# Patient Record
Sex: Male | Born: 1975 | Race: White | Hispanic: No | Marital: Married | Smoking: Never smoker
Health system: Southern US, Community
[De-identification: ages and names within clinical notes are randomized; demographics above are authoritative.]

## PROBLEM LIST (undated history)

## (undated) DIAGNOSIS — T7840XA Allergy, unspecified, initial encounter: Secondary | ICD-10-CM

## (undated) DIAGNOSIS — C9140 Hairy cell leukemia not having achieved remission: Secondary | ICD-10-CM

## (undated) HISTORY — DX: Allergy, unspecified, initial encounter: T78.40XA

## (undated) HISTORY — DX: Hairy cell leukemia not having achieved remission: C91.40

## (undated) HISTORY — PX: ROTATOR CUFF REPAIR: SHX139

## (undated) HISTORY — PX: INGUINAL HERNIA REPAIR: SUR1180

---

## 2006-11-05 ENCOUNTER — Ambulatory Visit: Payer: Self-pay | Admitting: Orthopaedic Surgery

## 2006-11-29 ENCOUNTER — Ambulatory Visit: Payer: Self-pay | Admitting: Orthopaedic Surgery

## 2008-03-04 ENCOUNTER — Ambulatory Visit: Payer: Self-pay | Admitting: Surgery

## 2008-03-05 ENCOUNTER — Ambulatory Visit: Payer: Self-pay | Admitting: Surgery

## 2009-09-25 HISTORY — PX: HERNIA REPAIR: SHX51

## 2009-10-18 ENCOUNTER — Encounter: Admission: RE | Admit: 2009-10-18 | Discharge: 2009-10-18 | Payer: Self-pay | Admitting: General Surgery

## 2009-11-18 ENCOUNTER — Ambulatory Visit (HOSPITAL_COMMUNITY): Admission: RE | Admit: 2009-11-18 | Discharge: 2009-11-19 | Payer: Self-pay | Admitting: General Surgery

## 2010-09-29 ENCOUNTER — Emergency Department: Payer: Self-pay | Admitting: Emergency Medicine

## 2010-12-15 LAB — DIFFERENTIAL
Basophils Relative: 0 % (ref 0–1)
Eosinophils Absolute: 0.1 10*3/uL (ref 0.0–0.7)
Eosinophils Relative: 1 % (ref 0–5)
Monocytes Relative: 5 % (ref 3–12)
Neutrophils Relative %: 74 % (ref 43–77)

## 2010-12-15 LAB — CBC
HCT: 46.5 % (ref 39.0–52.0)
Platelets: 155 10*3/uL (ref 150–400)
RDW: 13.7 % (ref 11.5–15.5)

## 2012-11-11 ENCOUNTER — Emergency Department: Payer: Self-pay | Admitting: Emergency Medicine

## 2014-03-30 ENCOUNTER — Ambulatory Visit: Payer: Self-pay | Admitting: Family Medicine

## 2014-04-02 ENCOUNTER — Encounter: Payer: Self-pay | Admitting: Podiatry

## 2014-04-02 ENCOUNTER — Ambulatory Visit (INDEPENDENT_AMBULATORY_CARE_PROVIDER_SITE_OTHER): Payer: 59 | Admitting: Podiatry

## 2014-04-02 ENCOUNTER — Ambulatory Visit (INDEPENDENT_AMBULATORY_CARE_PROVIDER_SITE_OTHER): Payer: 59

## 2014-04-02 VITALS — BP 130/78 | HR 84 | Resp 16 | Ht 73.0 in | Wt 235.0 lb

## 2014-04-02 DIAGNOSIS — M779 Enthesopathy, unspecified: Secondary | ICD-10-CM

## 2014-04-02 DIAGNOSIS — M7750 Other enthesopathy of unspecified foot: Secondary | ICD-10-CM

## 2014-04-02 DIAGNOSIS — M778 Other enthesopathies, not elsewhere classified: Secondary | ICD-10-CM

## 2014-04-02 MED ORDER — METHYLPREDNISOLONE (PAK) 4 MG PO TABS: ORAL_TABLET | ORAL | Status: DC

## 2014-04-02 NOTE — Progress Notes (Signed)
Kenneth Friedman presents today with a chief complaint of pain to his right anterior leg, his right lateral leg, his right knee his right ankle and the dorsal aspect of his right foot. He states this is been going on for about 2 months and he denies any trauma. He does go on to say that he has recently started exercising to lose weight and he plays ball with his children. His recently been seen by [facility] who confirmed an overuse injury. He was placed in a Cam Walker which the patient states that healing wore for short time.  Objective: Vital signs are stable he is alert and oriented x3. I have reviewed his past medical history medications allergies surgeries social history review of systems. Pulses are strongly palpable bilateral. Neurologic sensorium is intact per Semmes-Weinstein monofilament. Deep tendon reflexes are brisk and equal bilateral. Muscle strength is 5 over 5 dorsiflexors plantar flexors inverters everters all intrinsic musculature is intact. Orthopedic evaluation demonstrates all joints distal to the ankle a full range of motion without crepitus. He has no reproducible pain in the leg laterally or anteriorly. He has no reproducible pain in the ankle or the dorsum of the foot. He does have pain on sharp inversion and eversion of the subtalar joint with pain on palpation of the sinus tarsi of the right foot. Radiographic evaluation does not demonstrate any type of osseous abnormalities to either the ankle or the foot. There does appear to be some soft tissue increase in density at the tendo Achilles and some fluid collection in the anterior ankle.  Assessment: Knee pain associated with and over use injury right. Right leg pain associated with overuse injury. Subtalar joint capsulitis right foot.  Plan: Discussed etiology pathology conservative versus surgical therapies. I suggested an injection to the subtalar joint of the right foot. Which she declined. So I encouraged him to wear his Cam Walker. I wrote a  prescription which was electronically prescribed to his pharmacy for Medrol Dosepak to be followed by meloxicam. I will followup with him in one month and he will continue to ice the leg and foot. He will discontinue all extracurricular activities.

## 2014-04-30 ENCOUNTER — Encounter: Payer: Self-pay | Admitting: Podiatry

## 2014-04-30 ENCOUNTER — Ambulatory Visit (INDEPENDENT_AMBULATORY_CARE_PROVIDER_SITE_OTHER): Payer: 59 | Admitting: Podiatry

## 2014-04-30 VITALS — BP 116/67 | HR 81 | Resp 16

## 2014-04-30 DIAGNOSIS — M778 Other enthesopathies, not elsewhere classified: Secondary | ICD-10-CM

## 2014-04-30 DIAGNOSIS — M779 Enthesopathy, unspecified: Secondary | ICD-10-CM

## 2014-04-30 DIAGNOSIS — M7750 Other enthesopathy of unspecified foot: Secondary | ICD-10-CM

## 2014-04-30 NOTE — Progress Notes (Signed)
He presents today for followup of his capsulitis subtalar joint right his Achilles tendinitis. He states it is doing much better. He may longer has burning in his leg. He failed to take his Medrol Dosepak and his meloxicam.  Objective: Vital signs are stable he is alert and oriented x3. He has minimal tenderness on palpation of either the sinus tarsi or of the Achilles as it inserts on the right calcaneus.  Assessment: Well-healing tendinitis and capsulitis right foot.  Plan: Encouraged him to take his medication and we dispensed a night splint. I will followup with him in one month if necessary.

## 2014-06-04 ENCOUNTER — Ambulatory Visit (INDEPENDENT_AMBULATORY_CARE_PROVIDER_SITE_OTHER): Payer: 59 | Admitting: Podiatry

## 2014-06-04 ENCOUNTER — Encounter: Payer: Self-pay | Admitting: Podiatry

## 2014-06-04 DIAGNOSIS — M779 Enthesopathy, unspecified: Secondary | ICD-10-CM

## 2014-06-04 DIAGNOSIS — M7750 Other enthesopathy of unspecified foot: Secondary | ICD-10-CM

## 2014-06-04 DIAGNOSIS — M778 Other enthesopathies, not elsewhere classified: Secondary | ICD-10-CM

## 2014-06-04 NOTE — Progress Notes (Signed)
He presents today for followup of his subtalar joint capsulitis right his tendo Achilles tendinitis right. He states it is doing a lot better.  Objective: Vital signs are stable he is alert and oriented x3. His no reproducible pain on palpation of the tendo Achilles oral subtalar joint range of motion.  Assessment: Well-healing insertional tendo Achilles tendinitis and capsulitis of the subtalar joint right.  Plan: Continue all conservative therapies x1 more month and then discontinue it is pain has not recurred.

## 2014-07-07 ENCOUNTER — Ambulatory Visit: Payer: Self-pay

## 2014-07-07 LAB — CBC CANCER CENTER
Bands: 1 %
Eosinophil: 1 %
HCT: 46.4 % (ref 40.0–52.0)
HGB: 15.8 g/dL (ref 13.0–18.0)
Lymphocytes: 36 %
MCH: 32 pg (ref 26.0–34.0)
MCHC: 33.9 g/dL (ref 32.0–36.0)
MCV: 94 fL (ref 80–100)
MONOS PCT: 3 %
PLATELETS: 95 x10 3/mm — AB (ref 150–440)
RBC: 4.93 10*6/uL (ref 4.40–5.90)
RDW: 14.3 % (ref 11.5–14.5)
Segmented Neutrophils: 53 %
Variant Lymphocyte: 6 %
WBC: 2.4 x10 3/mm — ABNORMAL LOW (ref 3.8–10.6)

## 2014-07-07 LAB — PROTIME-INR
INR: 1.1
Prothrombin Time: 13.9 secs (ref 11.5–14.7)

## 2014-07-07 LAB — FIBRIN DEGRADATION PROD.(ARMC ONLY)

## 2014-07-07 LAB — LACTATE DEHYDROGENASE: LDH: 135 U/L (ref 85–241)

## 2014-07-07 LAB — APTT: ACTIVATED PTT: 33.5 s (ref 23.6–35.9)

## 2014-07-21 LAB — CBC CANCER CENTER
BASOS ABS: 0 x10 3/mm (ref 0.0–0.1)
BASOS PCT: 0.5 %
EOS ABS: 0 x10 3/mm (ref 0.0–0.7)
Eosinophil %: 1.3 %
HCT: 47.8 % (ref 40.0–52.0)
HGB: 16 g/dL (ref 13.0–18.0)
LYMPHS ABS: 1.6 x10 3/mm (ref 1.0–3.6)
Lymphocyte %: 48.8 %
MCH: 31.8 pg (ref 26.0–34.0)
MCHC: 33.5 g/dL (ref 32.0–36.0)
MCV: 95 fL (ref 80–100)
MONOS PCT: 2.3 %
Monocyte #: 0.1 x10 3/mm — ABNORMAL LOW (ref 0.2–1.0)
NEUTROS ABS: 1.5 x10 3/mm (ref 1.4–6.5)
Neutrophil %: 47.1 %
Platelet: 97 x10 3/mm — ABNORMAL LOW (ref 150–440)
RBC: 5.03 10*6/uL (ref 4.40–5.90)
RDW: 13.9 % (ref 11.5–14.5)
WBC: 3.2 x10 3/mm — AB (ref 3.8–10.6)

## 2014-07-26 ENCOUNTER — Ambulatory Visit: Payer: Self-pay

## 2014-09-03 ENCOUNTER — Ambulatory Visit: Payer: Self-pay | Admitting: Hematology and Oncology

## 2014-09-03 LAB — COMPREHENSIVE METABOLIC PANEL
ALBUMIN: 4 g/dL (ref 3.4–5.0)
ALK PHOS: 65 U/L
ANION GAP: 14 (ref 7–16)
AST: 12 U/L — AB (ref 15–37)
BUN: 17 mg/dL (ref 7–18)
Bilirubin,Total: 1.1 mg/dL — ABNORMAL HIGH (ref 0.2–1.0)
CALCIUM: 9.1 mg/dL (ref 8.5–10.1)
CO2: 23 mmol/L (ref 21–32)
CREATININE: 1.38 mg/dL — AB (ref 0.60–1.30)
Chloride: 106 mmol/L (ref 98–107)
EGFR (African American): >60
EGFR (Non-African Amer.): >60
Glucose: 136 mg/dL — ABNORMAL HIGH (ref 65–99)
OSMOLALITY: 289 (ref 275–301)
Potassium: 3.7 mmol/L (ref 3.5–5.1)
SGPT (ALT): 26 U/L
SODIUM: 143 mmol/L (ref 136–145)
Total Protein: 7.4 g/dL (ref 6.4–8.2)

## 2014-09-03 LAB — CBC CANCER CENTER
Basophil #: 0 x10 3/mm (ref 0.0–0.1)
Basophil %: 0.5 %
EOS ABS: 0 x10 3/mm (ref 0.0–0.7)
EOS PCT: 1.1 %
HCT: 45.4 % (ref 40.0–52.0)
HGB: 15.6 g/dL (ref 13.0–18.0)
Lymphocyte #: 1.1 x10 3/mm (ref 1.0–3.6)
Lymphocyte %: 44.1 %
MCH: 31.8 pg (ref 26.0–34.0)
MCHC: 34.4 g/dL (ref 32.0–36.0)
MCV: 92 fL (ref 80–100)
MONO ABS: 0 x10 3/mm — AB (ref 0.2–1.0)
MONOS PCT: 1.6 %
NEUTROS ABS: 1.3 x10 3/mm — AB (ref 1.4–6.5)
Neutrophil %: 52.7 %
PLATELETS: 97 x10 3/mm — AB (ref 150–440)
RBC: 4.92 10*6/uL (ref 4.40–5.90)
RDW: 13.6 % (ref 11.5–14.5)
WBC: 2.5 x10 3/mm — AB (ref 3.8–10.6)

## 2014-09-03 LAB — LACTATE DEHYDROGENASE: LDH: 152 U/L (ref 85–241)

## 2014-09-04 LAB — PROT IMMUNOELECTROPHORES(ARMC)

## 2014-09-11 ENCOUNTER — Ambulatory Visit: Payer: Self-pay | Admitting: Hematology and Oncology

## 2014-09-11 LAB — CBC WITH DIFFERENTIAL/PLATELET
BANDS NEUTROPHIL: 2 %
Comment - H1-Com1: NORMAL
Comment - H1-Com2: NORMAL
EOS PCT: 1 %
HCT: 44.8 % (ref 40.0–52.0)
HGB: 15.2 g/dL (ref 13.0–18.0)
LYMPHS PCT: 51 %
MCH: 32.1 pg (ref 26.0–34.0)
MCHC: 34.1 g/dL (ref 32.0–36.0)
MCV: 94 fL (ref 80–100)
Monocytes: 3 %
Platelet: 91 10*3/uL — ABNORMAL LOW (ref 150–440)
RBC: 4.75 10*6/uL (ref 4.40–5.90)
RDW: 13.4 % (ref 11.5–14.5)
Segmented Neutrophils: 35 %
Variant Lymphocyte - H1-Rlymph: 8 %
WBC: 2.7 10*3/uL — AB (ref 3.8–10.6)

## 2014-09-22 LAB — CBC WITH DIFFERENTIAL/PLATELET
BASOS ABS: 0 10*3/uL (ref 0.0–0.1)
BASOS PCT: 0.6 %
Eosinophil #: 0 10*3/uL (ref 0.0–0.7)
Eosinophil %: 0.9 %
HCT: 46 % (ref 40.0–52.0)
HGB: 15.6 g/dL (ref 13.0–18.0)
LYMPHS ABS: 1.3 10*3/uL (ref 1.0–3.6)
LYMPHS PCT: 52.4 %
MCH: 31.7 pg (ref 26.0–34.0)
MCHC: 33.9 g/dL (ref 32.0–36.0)
MCV: 93 fL (ref 80–100)
MONO ABS: 0.1 x10 3/mm — AB (ref 0.2–1.0)
MONOS PCT: 2.2 %
NEUTROS PCT: 43.9 %
Neutrophil #: 1.1 10*3/uL — ABNORMAL LOW (ref 1.4–6.5)
PLATELETS: 89 10*3/uL — AB (ref 150–440)
RBC: 4.93 10*6/uL (ref 4.40–5.90)
RDW: 13.6 % (ref 11.5–14.5)
WBC: 2.4 10*3/uL — ABNORMAL LOW (ref 3.8–10.6)

## 2014-09-25 ENCOUNTER — Ambulatory Visit: Payer: Self-pay | Admitting: Hematology and Oncology

## 2014-09-30 ENCOUNTER — Inpatient Hospital Stay: Payer: Self-pay | Admitting: Hematology and Oncology

## 2014-09-30 DIAGNOSIS — Z0189 Encounter for other specified special examinations: Secondary | ICD-10-CM

## 2014-09-30 LAB — COMPREHENSIVE METABOLIC PANEL
ANION GAP: 12 (ref 7–16)
AST: 11 U/L — AB (ref 15–37)
Albumin: 4.2 g/dL (ref 3.4–5.0)
Alkaline Phosphatase: 78 U/L
BUN: 16 mg/dL (ref 7–18)
Bilirubin,Total: 0.7 mg/dL (ref 0.2–1.0)
CALCIUM: 9.5 mg/dL (ref 8.5–10.1)
CHLORIDE: 104 mmol/L (ref 98–107)
Co2: 27 mmol/L (ref 21–32)
Creatinine: 1.35 mg/dL — ABNORMAL HIGH (ref 0.60–1.30)
EGFR (Non-African Amer.): >60
GLUCOSE: 112 mg/dL — AB (ref 65–99)
OSMOLALITY: 287 (ref 275–301)
POTASSIUM: 4 mmol/L (ref 3.5–5.1)
SGPT (ALT): 28 U/L
SODIUM: 143 mmol/L (ref 136–145)
Total Protein: 8.2 g/dL (ref 6.4–8.2)

## 2014-09-30 LAB — CBC CANCER CENTER
Basophil #: 0 x10 3/mm (ref 0.0–0.1)
Basophil %: 0.3 %
EOS ABS: 0 x10 3/mm (ref 0.0–0.7)
Eosinophil %: 1.3 %
HCT: 47.8 % (ref 40.0–52.0)
HGB: 16.4 g/dL (ref 13.0–18.0)
LYMPHS PCT: 49 %
Lymphocyte #: 1.4 x10 3/mm (ref 1.0–3.6)
MCH: 31.7 pg (ref 26.0–34.0)
MCHC: 34.3 g/dL (ref 32.0–36.0)
MCV: 93 fL (ref 80–100)
MONO ABS: 0.1 x10 3/mm — AB (ref 0.2–1.0)
Monocyte %: 2.1 %
NEUTROS ABS: 1.4 x10 3/mm (ref 1.4–6.5)
NEUTROS PCT: 47.3 %
Platelet: 103 x10 3/mm — ABNORMAL LOW (ref 150–440)
RBC: 5.16 10*6/uL (ref 4.40–5.90)
RDW: 13.6 % (ref 11.5–14.5)
WBC: 2.9 x10 3/mm — ABNORMAL LOW (ref 3.8–10.6)

## 2014-09-30 LAB — LACTATE DEHYDROGENASE: LDH: 141 U/L (ref 85–241)

## 2014-10-01 LAB — COMPREHENSIVE METABOLIC PANEL
ALK PHOS: 62 U/L
ALT: 25 U/L
Albumin: 3.6 g/dL (ref 3.4–5.0)
Anion Gap: 6 — ABNORMAL LOW (ref 7–16)
BILIRUBIN TOTAL: 0.7 mg/dL (ref 0.2–1.0)
BUN: 15 mg/dL (ref 7–18)
CHLORIDE: 105 mmol/L (ref 98–107)
CO2: 26 mmol/L (ref 21–32)
CREATININE: 1.34 mg/dL — AB (ref 0.60–1.30)
Calcium, Total: 9.2 mg/dL (ref 8.5–10.1)
EGFR (African American): >60
Glucose: 128 mg/dL — ABNORMAL HIGH (ref 65–99)
OSMOLALITY: 276 (ref 275–301)
Potassium: 4.3 mmol/L (ref 3.5–5.1)
SGOT(AST): 9 U/L — ABNORMAL LOW (ref 15–37)
SODIUM: 137 mmol/L (ref 136–145)
TOTAL PROTEIN: 7.2 g/dL (ref 6.4–8.2)

## 2014-10-01 LAB — CBC WITH DIFFERENTIAL/PLATELET
BASOS ABS: 0 10*3/uL (ref 0.0–0.1)
Basophil %: 0.1 %
EOS ABS: 0 10*3/uL (ref 0.0–0.7)
Eosinophil %: 0.1 %
HCT: 42.2 % (ref 40.0–52.0)
HGB: 14.5 g/dL (ref 13.0–18.0)
LYMPHS PCT: 19.8 %
Lymphocyte #: 0.7 10*3/uL — ABNORMAL LOW (ref 1.0–3.6)
MCH: 32.3 pg (ref 26.0–34.0)
MCHC: 34.3 g/dL (ref 32.0–36.0)
MCV: 94 fL (ref 80–100)
Monocyte #: 0.1 x10 3/mm — ABNORMAL LOW (ref 0.2–1.0)
Monocyte %: 1.8 %
NEUTROS ABS: 2.9 10*3/uL (ref 1.4–6.5)
NEUTROS PCT: 78.2 %
Platelet: 99 10*3/uL — ABNORMAL LOW (ref 150–440)
RBC: 4.48 10*6/uL (ref 4.40–5.90)
RDW: 13.4 % (ref 11.5–14.5)
WBC: 3.7 10*3/uL — ABNORMAL LOW (ref 3.8–10.6)

## 2014-10-02 LAB — CULTURE, FUNGUS WITHOUT SMEAR

## 2014-10-05 LAB — CBC WITH DIFFERENTIAL/PLATELET
BASOS ABS: 0 10*3/uL (ref 0.0–0.1)
Basophil %: 0.3 %
Eosinophil #: 0 10*3/uL (ref 0.0–0.7)
Eosinophil %: 1.9 %
HCT: 40.4 % (ref 40.0–52.0)
HGB: 13.8 g/dL (ref 13.0–18.0)
LYMPHS PCT: 19.3 %
Lymphocyte #: 0.3 10*3/uL — ABNORMAL LOW (ref 1.0–3.6)
MCH: 32 pg (ref 26.0–34.0)
MCHC: 34.2 g/dL (ref 32.0–36.0)
MCV: 94 fL (ref 80–100)
MONOS PCT: 0.3 %
Monocyte #: 0 x10 3/mm — ABNORMAL LOW (ref 0.2–1.0)
Neutrophil #: 1.4 10*3/uL (ref 1.4–6.5)
Neutrophil %: 78.2 %
PLATELETS: 75 10*3/uL — AB (ref 150–440)
RBC: 4.3 10*6/uL — ABNORMAL LOW (ref 4.40–5.90)
RDW: 13.2 % (ref 11.5–14.5)
WBC: 1.7 10*3/uL — AB (ref 3.8–10.6)

## 2014-10-05 LAB — COMPREHENSIVE METABOLIC PANEL
ALBUMIN: 3.2 g/dL — AB (ref 3.4–5.0)
ALK PHOS: 64 U/L
ALT: 27 U/L
Anion Gap: 9 (ref 7–16)
BUN: 14 mg/dL (ref 7–18)
Bilirubin,Total: 0.8 mg/dL (ref 0.2–1.0)
CALCIUM: 8.4 mg/dL — AB (ref 8.5–10.1)
CREATININE: 1.28 mg/dL (ref 0.60–1.30)
Chloride: 107 mmol/L (ref 98–107)
Co2: 26 mmol/L (ref 21–32)
EGFR (Non-African Amer.): >60
GLUCOSE: 128 mg/dL — AB (ref 65–99)
OSMOLALITY: 285 (ref 275–301)
Potassium: 3.8 mmol/L (ref 3.5–5.1)
SGOT(AST): 16 U/L (ref 15–37)
SODIUM: 142 mmol/L (ref 136–145)
Total Protein: 6.6 g/dL (ref 6.4–8.2)

## 2014-10-05 LAB — MAGNESIUM: MAGNESIUM: 1.9 mg/dL

## 2014-10-07 LAB — COMPREHENSIVE METABOLIC PANEL
ALK PHOS: 69 U/L
ALT: 31 U/L
AST: 11 U/L — AB (ref 15–37)
Albumin: 3.4 g/dL (ref 3.4–5.0)
Anion Gap: 5 — ABNORMAL LOW (ref 7–16)
BUN: 17 mg/dL (ref 7–18)
Bilirubin,Total: 1 mg/dL (ref 0.2–1.0)
CHLORIDE: 107 mmol/L (ref 98–107)
CO2: 27 mmol/L (ref 21–32)
Calcium, Total: 8.5 mg/dL (ref 8.5–10.1)
Creatinine: 1.31 mg/dL — ABNORMAL HIGH (ref 0.60–1.30)
Glucose: 91 mg/dL (ref 65–99)
Osmolality: 279 (ref 275–301)
POTASSIUM: 4 mmol/L (ref 3.5–5.1)
SODIUM: 139 mmol/L (ref 136–145)
TOTAL PROTEIN: 6.9 g/dL (ref 6.4–8.2)

## 2014-10-07 LAB — CBC WITH DIFFERENTIAL/PLATELET
BASOS ABS: 0 10*3/uL (ref 0.0–0.1)
Basophil %: 0.4 %
EOS ABS: 0 10*3/uL (ref 0.0–0.7)
Eosinophil %: 2.6 %
HCT: 39.8 % — AB (ref 40.0–52.0)
HGB: 13.7 g/dL (ref 13.0–18.0)
LYMPHS ABS: 0.1 10*3/uL — AB (ref 1.0–3.6)
LYMPHS PCT: 11.1 %
MCH: 32.2 pg (ref 26.0–34.0)
MCHC: 34.4 g/dL (ref 32.0–36.0)
MCV: 94 fL (ref 80–100)
MONO ABS: 0 x10 3/mm — AB (ref 0.2–1.0)
MONOS PCT: 0.8 %
NEUTROS ABS: 0.9 10*3/uL — AB (ref 1.4–6.5)
Neutrophil %: 85.1 %
Platelet: 66 10*3/uL — ABNORMAL LOW (ref 150–440)
RBC: 4.25 10*6/uL — AB (ref 4.40–5.90)
RDW: 13.8 % (ref 11.5–14.5)
WBC: 1.1 10*3/uL — AB (ref 3.8–10.6)

## 2014-10-08 LAB — BETA STREP CULTURE(ARMC)

## 2014-10-09 LAB — CBC CANCER CENTER
Basophil #: 0 x10 3/mm (ref 0.0–0.1)
Basophil %: 0.5 %
EOS PCT: 2.3 %
Eosinophil #: 0 x10 3/mm (ref 0.0–0.7)
HCT: 40.4 % (ref 40.0–52.0)
HGB: 13.8 g/dL (ref 13.0–18.0)
LYMPHS ABS: 0.1 x10 3/mm — AB (ref 1.0–3.6)
Lymphocyte %: 12.7 %
MCH: 31.4 pg (ref 26.0–34.0)
MCHC: 34.3 g/dL (ref 32.0–36.0)
MCV: 92 fL (ref 80–100)
MONO ABS: 0 x10 3/mm — AB (ref 0.2–1.0)
Monocyte %: 2.1 %
NEUTROS ABS: 0.8 x10 3/mm — AB (ref 1.4–6.5)
Neutrophil %: 82.4 %
PLATELETS: 64 x10 3/mm — AB (ref 150–440)
RBC: 4.42 10*6/uL (ref 4.40–5.90)
RDW: 13.4 % (ref 11.5–14.5)
WBC: 0.9 x10 3/mm — CL (ref 3.8–10.6)

## 2014-10-09 LAB — COMPREHENSIVE METABOLIC PANEL
ALBUMIN: 3.7 g/dL (ref 3.4–5.0)
ALK PHOS: 84 U/L
ALT: 44 U/L
AST: 20 U/L (ref 15–37)
Anion Gap: 9 (ref 7–16)
BUN: 15 mg/dL (ref 7–18)
Bilirubin,Total: 1.8 mg/dL — ABNORMAL HIGH (ref 0.2–1.0)
CALCIUM: 8.9 mg/dL (ref 8.5–10.1)
Chloride: 100 mmol/L (ref 98–107)
Co2: 29 mmol/L (ref 21–32)
Creatinine: 1.31 mg/dL — ABNORMAL HIGH (ref 0.60–1.30)
EGFR (African American): >60
GLUCOSE: 122 mg/dL — AB (ref 65–99)
Osmolality: 278 (ref 275–301)
Potassium: 3.7 mmol/L (ref 3.5–5.1)
Sodium: 138 mmol/L (ref 136–145)
Total Protein: 7.9 g/dL (ref 6.4–8.2)

## 2014-10-09 LAB — URINALYSIS, COMPLETE
BILIRUBIN, UR: NEGATIVE
Bacteria: NONE SEEN
Blood: NEGATIVE
GLUCOSE, UR: NEGATIVE mg/dL (ref 0–75)
Ketone: NEGATIVE
LEUKOCYTE ESTERASE: NEGATIVE
Nitrite: NEGATIVE
Ph: 7 (ref 4.5–8.0)
Protein: NEGATIVE
RBC,UR: 4 /HPF (ref 0–5)
SQUAMOUS EPITHELIAL: NONE SEEN
Specific Gravity: 1.018 (ref 1.003–1.030)

## 2014-10-10 LAB — COMPREHENSIVE METABOLIC PANEL
Albumin: 3.2 g/dL — ABNORMAL LOW (ref 3.4–5.0)
Alkaline Phosphatase: 72 U/L
Anion Gap: 8 (ref 7–16)
BILIRUBIN TOTAL: 1.9 mg/dL — AB (ref 0.2–1.0)
BUN: 17 mg/dL (ref 7–18)
CALCIUM: 8.8 mg/dL (ref 8.5–10.1)
CHLORIDE: 101 mmol/L (ref 98–107)
CO2: 26 mmol/L (ref 21–32)
Creatinine: 1.31 mg/dL — ABNORMAL HIGH (ref 0.60–1.30)
Glucose: 96 mg/dL (ref 65–99)
OSMOLALITY: 272 (ref 275–301)
Potassium: 4.1 mmol/L (ref 3.5–5.1)
SGOT(AST): 20 U/L (ref 15–37)
SGPT (ALT): 37 U/L
Sodium: 135 mmol/L — ABNORMAL LOW (ref 136–145)
Total Protein: 7.4 g/dL (ref 6.4–8.2)

## 2014-10-10 LAB — CBC WITH DIFFERENTIAL/PLATELET
BASOS ABS: 0 10*3/uL (ref 0.0–0.1)
BASOS PCT: 0.2 %
EOS PCT: 0.7 %
Eosinophil #: 0 10*3/uL (ref 0.0–0.7)
HCT: 35.8 % — AB (ref 40.0–52.0)
HGB: 12.5 g/dL — AB (ref 13.0–18.0)
Lymphocyte #: 0.2 10*3/uL — ABNORMAL LOW (ref 1.0–3.6)
Lymphocyte %: 8.5 %
MCH: 31.9 pg (ref 26.0–34.0)
MCHC: 34.9 g/dL (ref 32.0–36.0)
MCV: 92 fL (ref 80–100)
MONOS PCT: 1.7 %
Monocyte #: 0 x10 3/mm — ABNORMAL LOW (ref 0.2–1.0)
Neutrophil #: 1.7 10*3/uL (ref 1.4–6.5)
Neutrophil %: 88.9 %
PLATELETS: 59 10*3/uL — AB (ref 150–440)
RBC: 3.91 10*6/uL — ABNORMAL LOW (ref 4.40–5.90)
RDW: 13.6 % (ref 11.5–14.5)
WBC: 2 10*3/uL — CL (ref 3.8–10.6)

## 2014-10-11 ENCOUNTER — Inpatient Hospital Stay: Payer: Self-pay | Admitting: Hematology and Oncology

## 2014-10-11 LAB — MISC AER/ANAEROBIC CULT.

## 2014-10-11 LAB — CBC WITH DIFFERENTIAL/PLATELET
BASOS ABS: 1 %
Bands: 4 %
HCT: 34.4 % — AB (ref 40.0–52.0)
HGB: 12.1 g/dL — ABNORMAL LOW (ref 13.0–18.0)
LYMPHS PCT: 23 %
MCH: 32.3 pg (ref 26.0–34.0)
MCHC: 35.1 g/dL (ref 32.0–36.0)
MCV: 92 fL (ref 80–100)
MONOS PCT: 3 %
Metamyelocyte: 2 %
Other Cells Blood: 2
Platelet: 61 10*3/uL — ABNORMAL LOW (ref 150–440)
RBC: 3.75 10*6/uL — AB (ref 4.40–5.90)
RDW: 13.9 % (ref 11.5–14.5)
Segmented Neutrophils: 56 %
VARIANT LYMPHOCYTE - H1-RLYMPH: 9 %
WBC: 2.2 10*3/uL — AB (ref 3.8–10.6)

## 2014-10-11 LAB — URINE CULTURE

## 2014-10-12 LAB — CBC WITH DIFFERENTIAL/PLATELET
Bands: 4 %
COMMENT - H1-COM2: NORMAL
Comment - H1-Com1: NORMAL
HCT: 34.6 % — ABNORMAL LOW (ref 40.0–52.0)
HGB: 12.3 g/dL — ABNORMAL LOW (ref 13.0–18.0)
Lymphocytes: 53 %
MCH: 32 pg (ref 26.0–34.0)
MCHC: 35.5 g/dL (ref 32.0–36.0)
MCV: 90 fL (ref 80–100)
Monocytes: 4 %
Platelet: 70 10*3/uL — ABNORMAL LOW (ref 150–440)
RBC: 3.84 10*6/uL — ABNORMAL LOW (ref 4.40–5.90)
RDW: 13.6 % (ref 11.5–14.5)
Segmented Neutrophils: 29 %
Variant Lymphocyte - H1-Rlymph: 10 %
WBC: 4.3 10*3/uL (ref 3.8–10.6)

## 2014-10-12 LAB — CREATININE, SERUM
CREATININE: 1.22 mg/dL (ref 0.60–1.30)
EGFR (African American): >60
EGFR (Non-African Amer.): >60

## 2014-10-12 LAB — VANCOMYCIN, TROUGH: Vancomycin, Trough: 9 ug/mL — ABNORMAL LOW (ref 10–20)

## 2014-10-14 LAB — CULTURE, BLOOD (SINGLE)

## 2014-10-15 LAB — CBC CANCER CENTER
BASOS ABS: 0 x10 3/mm (ref 0.0–0.1)
Basophil %: 1.1 %
EOS PCT: 1 %
Eosinophil #: 0 x10 3/mm (ref 0.0–0.7)
HCT: 36.8 % — AB (ref 40.0–52.0)
HGB: 12.8 g/dL — AB (ref 13.0–18.0)
LYMPHS ABS: 2 x10 3/mm (ref 1.0–3.6)
Lymphocyte %: 67.8 %
MCH: 31.4 pg (ref 26.0–34.0)
MCHC: 34.8 g/dL (ref 32.0–36.0)
MCV: 90 fL (ref 80–100)
Monocyte #: 0 x10 3/mm — ABNORMAL LOW (ref 0.2–1.0)
Monocyte %: 1.2 %
NEUTROS ABS: 0.8 x10 3/mm — AB (ref 1.4–6.5)
NEUTROS PCT: 28.9 %
Platelet: 145 x10 3/mm — ABNORMAL LOW (ref 150–440)
RBC: 4.08 10*6/uL — AB (ref 4.40–5.90)
RDW: 13.9 % (ref 11.5–14.5)
WBC: 2.9 x10 3/mm — ABNORMAL LOW (ref 3.8–10.6)

## 2014-10-15 LAB — CULTURE, BLOOD (SINGLE)

## 2014-10-15 LAB — COMPREHENSIVE METABOLIC PANEL
ALK PHOS: 72 U/L
ALT: 50 U/L
AST: 24 U/L (ref 15–37)
Albumin: 3.3 g/dL — ABNORMAL LOW (ref 3.4–5.0)
Anion Gap: 10 (ref 7–16)
BUN: 12 mg/dL (ref 7–18)
Bilirubin,Total: 0.7 mg/dL (ref 0.2–1.0)
CHLORIDE: 106 mmol/L (ref 98–107)
CO2: 25 mmol/L (ref 21–32)
CREATININE: 1.17 mg/dL (ref 0.60–1.30)
Calcium, Total: 8.7 mg/dL (ref 8.5–10.1)
Glucose: 108 mg/dL — ABNORMAL HIGH (ref 65–99)
OSMOLALITY: 282 (ref 275–301)
Potassium: 3.9 mmol/L (ref 3.5–5.1)
Sodium: 141 mmol/L (ref 136–145)
Total Protein: 7.1 g/dL (ref 6.4–8.2)

## 2014-10-21 LAB — COMPREHENSIVE METABOLIC PANEL
ALT: 45 U/L (ref 14–63)
Albumin: 3.4 g/dL (ref 3.4–5.0)
Alkaline Phosphatase: 75 U/L (ref 46–116)
Anion Gap: 9 (ref 7–16)
BUN: 12 mg/dL (ref 7–18)
Bilirubin,Total: 0.8 mg/dL (ref 0.2–1.0)
CHLORIDE: 105 mmol/L (ref 98–107)
CO2: 27 mmol/L (ref 21–32)
CREATININE: 1.03 mg/dL (ref 0.60–1.30)
Calcium, Total: 8.4 mg/dL — ABNORMAL LOW (ref 8.5–10.1)
EGFR (African American): >60
EGFR (Non-African Amer.): >60
Glucose: 85 mg/dL (ref 65–99)
OSMOLALITY: 280 (ref 275–301)
Potassium: 4.2 mmol/L (ref 3.5–5.1)
SGOT(AST): 16 U/L (ref 15–37)
SODIUM: 141 mmol/L (ref 136–145)
TOTAL PROTEIN: 7 g/dL (ref 6.4–8.2)

## 2014-10-21 LAB — CBC CANCER CENTER
BASOS PCT: 0.4 %
Basophil #: 0 x10 3/mm (ref 0.0–0.1)
Eosinophil #: 0.1 x10 3/mm (ref 0.0–0.7)
Eosinophil %: 3.3 %
HCT: 35.2 % — ABNORMAL LOW (ref 40.0–52.0)
HGB: 12.3 g/dL — ABNORMAL LOW (ref 13.0–18.0)
LYMPHS PCT: 27.5 %
Lymphocyte #: 0.5 x10 3/mm — ABNORMAL LOW (ref 1.0–3.6)
MCH: 31.9 pg (ref 26.0–34.0)
MCHC: 34.9 g/dL (ref 32.0–36.0)
MCV: 92 fL (ref 80–100)
MONOS PCT: 4.7 %
Monocyte #: 0.1 x10 3/mm — ABNORMAL LOW (ref 0.2–1.0)
NEUTROS PCT: 64.1 %
Neutrophil #: 1.2 x10 3/mm — ABNORMAL LOW (ref 1.4–6.5)
Platelet: 230 x10 3/mm (ref 150–440)
RBC: 3.84 10*6/uL — AB (ref 4.40–5.90)
RDW: 14.9 % — ABNORMAL HIGH (ref 11.5–14.5)
WBC: 1.9 x10 3/mm — CL (ref 3.8–10.6)

## 2014-10-23 LAB — CBC CANCER CENTER
BASOS ABS: 0 x10 3/mm (ref 0.0–0.1)
BASOS PCT: 0.8 %
EOS ABS: 0.1 x10 3/mm (ref 0.0–0.7)
Eosinophil %: 5 %
HCT: 36.2 % — AB (ref 40.0–52.0)
HGB: 12.3 g/dL — AB (ref 13.0–18.0)
Lymphocyte #: 0.5 x10 3/mm — ABNORMAL LOW (ref 1.0–3.6)
Lymphocyte %: 26.5 %
MCH: 31.4 pg (ref 26.0–34.0)
MCHC: 34 g/dL (ref 32.0–36.0)
MCV: 92 fL (ref 80–100)
MONOS PCT: 8.3 %
Monocyte #: 0.1 x10 3/mm — ABNORMAL LOW (ref 0.2–1.0)
NEUTROS ABS: 1 x10 3/mm — AB (ref 1.4–6.5)
Neutrophil %: 59.4 %
Platelet: 198 x10 3/mm (ref 150–440)
RBC: 3.92 10*6/uL — ABNORMAL LOW (ref 4.40–5.90)
RDW: 15.3 % — AB (ref 11.5–14.5)
WBC: 1.7 x10 3/mm — AB (ref 3.8–10.6)

## 2014-10-26 ENCOUNTER — Ambulatory Visit: Payer: Self-pay | Admitting: Hematology and Oncology

## 2014-10-30 LAB — CBC CANCER CENTER
Basophil #: 0 x10 3/mm (ref 0.0–0.1)
Basophil %: 0.9 %
Eosinophil #: 0.3 x10 3/mm (ref 0.0–0.7)
Eosinophil %: 12.2 %
HCT: 40.5 % (ref 40.0–52.0)
HGB: 14 g/dL (ref 13.0–18.0)
Lymphocyte #: 0.5 x10 3/mm — ABNORMAL LOW (ref 1.0–3.6)
Lymphocyte %: 19.8 %
MCH: 32.1 pg (ref 26.0–34.0)
MCHC: 34.5 g/dL (ref 32.0–36.0)
MCV: 93 fL (ref 80–100)
Monocyte #: 0.6 x10 3/mm (ref 0.2–1.0)
Monocyte %: 24.3 %
Neutrophil #: 1 x10 3/mm — ABNORMAL LOW (ref 1.4–6.5)
Neutrophil %: 42.8 %
Platelet: 116 x10 3/mm — ABNORMAL LOW (ref 150–440)
RBC: 4.36 10*6/uL — ABNORMAL LOW (ref 4.40–5.90)
RDW: 17.8 % — ABNORMAL HIGH (ref 11.5–14.5)
WBC: 2.3 x10 3/mm — ABNORMAL LOW (ref 3.8–10.6)

## 2014-11-24 ENCOUNTER — Ambulatory Visit
Admit: 2014-11-24 | Disposition: A | Discharge status: Home / Self Care | Payer: Self-pay | Attending: Hematology and Oncology | Admitting: Hematology and Oncology

## 2014-12-10 ENCOUNTER — Ambulatory Visit (INDEPENDENT_AMBULATORY_CARE_PROVIDER_SITE_OTHER): Payer: 59 | Admitting: General Surgery

## 2014-12-10 ENCOUNTER — Encounter: Payer: Self-pay | Admitting: General Surgery

## 2014-12-10 VITALS — BP 108/70 | HR 72 | Resp 12 | Ht 73.0 in | Wt 235.0 lb

## 2014-12-10 DIAGNOSIS — K8020 Calculus of gallbladder without cholecystitis without obstruction: Secondary | ICD-10-CM

## 2014-12-10 NOTE — Patient Instructions (Addendum)

## 2014-12-10 NOTE — Progress Notes (Signed)
Patient ID: Kenneth Senter., male   DOB: 01-29-1976, 39 y.o.   MRN: [id number]  Chief Complaint  Patient presents with  . Abdominal Pain    gall stones    HPI Kenneth Ryback. is a 39 y.o. male.  Here today to evaluate gall stones and abdominal pain. He states for several months he has had abdominal pain but attributed it to his liver and spleen. Until 2 weekends ago he had several abdominal pain that lasted 3 days. No nausea or vomiting. The pain is usually after he eats. He has been eating a bland diet. His last chemotherapy ws January 13 th 2016.  HPI  Past Medical History  Diagnosis Date  . Hairy cell leukemia     Past Surgical History  Procedure Laterality Date  . Rotator cuff repair Right 2009, 2010  . Hernia repair  2011    Family History  Problem Relation Age of Onset  . Hypertension Mother   . Hypertension Father     Social History History  Substance Use Topics  . Smoking status: Never Smoker   . Smokeless tobacco: Never Used  . Alcohol Use: Yes     Comment: rarely    Allergies  Allergen Reactions  . Diclofenac Other (See Comments)    GI DISTRESS  . Fentanyl Other (See Comments)    Low tolerance    Current Outpatient Prescriptions  Medication Sig Dispense Refill  . ibuprofen (ADVIL,MOTRIN) 200 MG tablet Take 200 mg by mouth every 6 (six) hours as needed.    . Multiple Vitamin (MULTIVITAMIN) tablet Take 1 tablet by mouth daily.    Marland Kitchen sulfamethoxazole-trimethoprim (BACTRIM DS,SEPTRA DS) 800-160 MG per tablet Take 1 tablet by mouth 2 (two) times daily.    . valACYclovir (VALTREX) 500 MG tablet Take 500 mg by mouth daily.     No current facility-administered medications for this visit.    Review of Systems Review of Systems  Constitutional: Negative.   Respiratory: Negative.   Cardiovascular: Negative.   Gastrointestinal: Positive for abdominal pain. Negative for nausea, vomiting, diarrhea and constipation.    Blood pressure 108/70,  pulse 72, resp. rate 12, height 6\' 1"  (1.854 m), weight 235 lb (106.595 kg).  Physical Exam Physical Exam  Constitutional: He is oriented to person, place, and time. He appears well-developed and well-nourished.  Eyes: Conjunctivae are normal. No scleral icterus.  Neck: Neck supple.  Cardiovascular: Normal rate, regular rhythm and normal heart sounds.   Pulmonary/Chest: Effort normal and breath sounds normal.  Abdominal: Soft. Normal appearance. There is no hepatomegaly. There is no tenderness. No hernia.  Lymphadenopathy:    He has no cervical adenopathy.  Neurological: He is alert and oriented to person, place, and time.  Skin: Skin is warm and dry.    Data Reviewed Office notes, labs, abdominal ultrasound  Assessment    Gallstones.Pt has completed treatment for his hairy cell leukemia. This would be a good time to have the gallbladder removed. Pt is symptomatic with biliary colic. Most recent labs showed WBC of 4.9K, Platelet count of 139K.    Plan    Patient is already scheduled for lab work 12-11-14.  Laparoscopic Cholecystectomy with Intraoperative Cholangiogram. The procedure, including it's potential risks and complications (including but not limited to infection, bleeding, injury to intra-abdominal organs or bile ducts, bile leak, poor cosmetic result, sepsis and death) were discussed with the patient in detail. Non-operative options, including their inherent risks (acute calculous cholecystitis with possible choledocholithiasis or  gallstone pancreatitis, with the risk of ascending cholangitis, sepsis, and death) were discussed as well. The patient expressed and understanding of what we discussed and wishes to proceed with laparoscopic cholecystectomy. The patient further understands that if it is technically not possible, or it is unsafe to proceed laparoscopically, that I will convert to an open cholecystectomy.     Patient's surgery has been scheduled for 12-15-14 at  [facility].  Karie Fetch M 12/10/2014, 2:24 PM

## 2014-12-15 ENCOUNTER — Encounter: Payer: Self-pay | Admitting: General Surgery

## 2014-12-15 ENCOUNTER — Ambulatory Visit: Payer: Self-pay | Admitting: General Surgery

## 2014-12-15 DIAGNOSIS — K660 Peritoneal adhesions (postprocedural) (postinfection): Secondary | ICD-10-CM | POA: Diagnosis not present

## 2014-12-15 DIAGNOSIS — K8010 Calculus of gallbladder with chronic cholecystitis without obstruction: Secondary | ICD-10-CM | POA: Diagnosis not present

## 2014-12-15 HISTORY — PX: CHOLECYSTECTOMY: SHX55

## 2014-12-17 ENCOUNTER — Encounter: Payer: Self-pay | Admitting: General Surgery

## 2014-12-24 ENCOUNTER — Ambulatory Visit (INDEPENDENT_AMBULATORY_CARE_PROVIDER_SITE_OTHER): Payer: 59 | Admitting: General Surgery

## 2014-12-24 ENCOUNTER — Encounter: Payer: Self-pay | Admitting: General Surgery

## 2014-12-24 VITALS — BP 118/78 | HR 68 | Resp 12 | Ht 73.0 in | Wt 234.0 lb

## 2014-12-24 DIAGNOSIS — K8020 Calculus of gallbladder without cholecystitis without obstruction: Secondary | ICD-10-CM

## 2014-12-24 NOTE — Progress Notes (Signed)
This is a 38 year old male here today for her post op gallbladder done on 12/15/14. Surgery involved significant takedown of adhesions- approximately 30 mins time. Surgery uneventful otherwise Patient states he is making progress. Still sore. Bowels moving every other day. Port sites are clean and healing well. Abdomen is soft and lungs are clear.  Patient to return as needed.

## 2014-12-24 NOTE — Patient Instructions (Addendum)
Patient to return as needed. 

## 2014-12-25 ENCOUNTER — Ambulatory Visit
Admit: 2014-12-25 | Disposition: A | Discharge status: Home / Self Care | Payer: Self-pay | Attending: Hematology and Oncology | Admitting: Hematology and Oncology

## 2015-01-11 LAB — COMPREHENSIVE METABOLIC PANEL
Albumin: 4.6 g/dL
Alkaline Phosphatase: 85 U/L
Anion Gap: 6 — ABNORMAL LOW (ref 7–16)
BUN: 16 mg/dL
Bilirubin,Total: 1.4 mg/dL — ABNORMAL HIGH
Calcium, Total: 9.3 mg/dL
Chloride: 107 mmol/L
Co2: 24 mmol/L
Creatinine: 1.09 mg/dL
EGFR (African American): >60
EGFR (Non-African Amer.): >60
Glucose: 116 mg/dL — ABNORMAL HIGH
Potassium: 3.9 mmol/L
SGOT(AST): 26 U/L
SGPT (ALT): 27 U/L
Sodium: 137 mmol/L
Total Protein: 8 g/dL

## 2015-01-11 LAB — CBC CANCER CENTER
Basophil #: 0 x10 3/mm (ref 0.0–0.1)
Basophil %: 0.4 %
Eosinophil #: 0.2 x10 3/mm (ref 0.0–0.7)
Eosinophil %: 4.8 %
HCT: 46.1 % (ref 40.0–52.0)
HGB: 15.9 g/dL (ref 13.0–18.0)
Lymphocyte #: 0.6 x10 3/mm — ABNORMAL LOW (ref 1.0–3.6)
Lymphocyte %: 13.4 %
MCH: 30 pg (ref 26.0–34.0)
MCHC: 34.5 g/dL (ref 32.0–36.0)
MCV: 87 fL (ref 80–100)
Monocyte #: 0.4 x10 3/mm (ref 0.2–1.0)
Monocyte %: 8.2 %
Neutrophil #: 3.4 x10 3/mm (ref 1.4–6.5)
Neutrophil %: 73.2 %
Platelet: 112 x10 3/mm — ABNORMAL LOW (ref 150–440)
RBC: 5.31 10*6/uL (ref 4.40–5.90)
RDW: 13.4 % (ref 11.5–14.5)
WBC: 4.7 x10 3/mm (ref 3.8–10.6)

## 2015-01-14 LAB — CREATININE, SERUM: CREATINE, SERUM: 1.09

## 2015-01-18 LAB — SURGICAL PATHOLOGY

## 2015-01-24 NOTE — Op Note (Signed)
PATIENT NAME:  Kenneth Friedman, Kenneth Friedman MR#:  [id number] DATE OF BIRTH:  08/18/1976  DATE OF PROCEDURE:  12/15/2014  PREOPERATIVE DIAGNOSIS: Chronic cholecystitis and cholelithiasis.   POSTOPERATIVE DIAGNOSIS: Chronic cholecystitis and cholelithiasis, extensive omental adhesions in the upper abdomen.   PROCEDURES PERFORMED: Laparoscopy, lysis of adhesions, cholecystectomy, and intraoperative cholangiogram.   SURGEON: Mckinley Jewel, M.D.   ANESTHESIA: General.   COMPLICATIONS: None.   ESTIMATED BLOOD LOSS: Minimal at 25 mL max.   COMPLICATIONS: None.   DESCRIPTION OF PROCEDURE: The patient was put to sleep in the supine position on the operating table and the abdomen was prepped and draped out as a sterile field. Timeout was performed. A port site incision was made at the inferior lip of the umbilicus. The Veress needle with the InnerDyne sleeve was positioned in the peritoneal cavity after the fascia was lifted up with a tracheal hook. Position was verified with the hanging drop method and pneumoperitoneum was obtained. A 10 mm port was passed and the camera was introduced with the finding that pretty much the entire upper abdomen was covered with omental adhesion which was tenting up the transverse colon. It did not appear that there was an adequate window for the epigastric port site at this time. With the camera positioned in the right side of the abdomen, it was swung around on top of the liver until the falciform ligament could be identified and then an epigastric port 5 mm port was placed. Following this the adhesions of the omentum, in the right upper quadrant, draped across the liver was taken down with cautery exposing the liver area satisfactorily. The two lateral 5 mm ports were then placed and with gentle traction and exposure omental adhesions surrounding the gallbladder and the liver were taken down carefully until the gallbladder itself could be identified and this was subsequently  dissected off with the adhesions being pushed away from the [place] pouch area. There was some tenting of the stomach and the pylorus from adhesions. These were easily taken down with cautery. The adhesiolysis took approximately 30 to 35 minutes. After adequate exposure was obtained of the gallbladder, it was placed in cephalad traction and the Hartmann pouch was then lifted up. The cystic duct was identified and freed. Kumar clamp and catheter were then positioned and cholangiogram was performed. There appeared to be filling of the bile duct from the junction of the cystic duct down. The proximal duct could not be adequately filled. The bile duct was very narrow and long and emptied into the duodenum without any signs of obstruction. The cystic duct was somewhat elongated and tortuous. The catheter was used to decompress the gallbladder, removed, and the cystic duct was hemoclipped and cut. The cystic artery and 1 posterior branch was identified. These were separately hemoclipped and cut and the gallbladder was then dissected free from its bed using cautery for control of bleeding. Following this some of the adhesions surrounding the port placed at the umbilicus were taken down with the 5 mm scope used in the lateral port site, and after these were satisfactorily freed the gallbladder was placed in a retrieval bag and brought out through the umbilical port site. It was noted to contain stones. The gallbladder bed and upper quadrant were irrigated with saline and suctioned out, the pneumoperitoneum was released and the ports were then removed. The fascial opening of the umbilicus was closed with 0 Vicryl and all the skin incisions with subcuticular 4-0 Vicryl reinforced with Steri-Strips. A dry  sterile dressing was placed. The patient tolerated the procedure well. No immediate problems were encountered. He was subsequently extubated and returned to the recovery room in stable condition.   ____________________________ S.Robinette Haines, MD sgs:sb D: 12/15/2014 13:04:47 ET T: 12/15/2014 13:57:37 ET JOB#: [id number]  cc: S.G. Jamal Collin, MD, <Dictator> [facility] Robinette Haines MD ELECTRONICALLY SIGNED 12/16/2014 16:18

## 2015-01-24 NOTE — Discharge Summary (Signed)
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 Admitted for chemotherapy.    He was given claribine, 0.09 to 0.1 mg/kg/day by continuous infusion x 7 days which he tolerated well;   Allergies:  Voltaren: GI Distress  PERTINENT RADIOLOGY STUDIES: XRay:    06-Jan-16 12:45, Chest 1 View AP or PA  Chest 1 View AP or PA   REASON FOR EXAM:    chemo going to be started  COMMENTS:       PROCEDURE: DXR - DXR CHEST 1 VIEWAP OR PA  - Sep 30 2014 12:45PM     CLINICAL DATA:  Chest pain and cough    EXAM:  CHEST - 1 VIEW    COMPARISON:  09/29/2010    FINDINGS:  The heart size and mediastinal contours are within normal limits.  Both lungs are clear. The visualized skeletal structures are  unremarkable.     IMPRESSION:  No active disease.      Electronically Signed    By: Inez Catalina M.D.    On: 09/30/2014 13:18         Verified By: Everlene Farrier, M.D.,  LabUnknown:  PACS Image    Pertinent Past History:  Pertinent Past History [facility] Course:  Hospital Course tolerated chemotherapy well   Condition on Discharge Good   Code Status:  Code Status Full Code   DISCHARGE INSTRUCTIONS HOME MEDS:  Medication Reconciliation: Patient's Home Medications at Discharge:     Medication Instructions  levaquin 750 mg oral tablet  1 tab(s) orally every 12 hours; start immediately if temperature over 100.4   amoxicillin-clavulanate 875 mg-125 mg oral tablet  1 tab(s) orally every 12 hours; start immediately if temperature over 100.4   lorazepam 1 mg oral tablet  1-2 three times a day prn sleep, nausea, or anxiety   acetaminophen 325 mg oral tablet  2 tab(s) orally every 4 hours, As needed, mild pain (1-3/10)  or temp. greater than 100.4   magnesium hydroxide 8% oral suspension  30 milliliter(s) orally once a day, As needed, constipation   docusate sodium 100 mg oral capsule  1 cap(s) orally 2 times a day   benzocaine-menthol topical  1 lozenge orally every hour, As needed, cough, sore throat   ondansetron 8 mg oral tablet  1 cap(s) orally 4 times a day, As Needed - for Nausea, Vomiting   lorazepam 1 mg oral tablet  1 tab(s) orally every 4 hours, As needed, anxiety   lorazepam 1 mg oral tablet  1 tab(s) orally every 4 hours, As needed, anxiety, nausea, or sleep     Physician's Instructions:  Diet Neutropenic diet or well-washed foods only   Activity Limitations Avoid crowds and outdoors/woods   Return to Work will discuss   Time frame for Follow Up Appointment 1-2 days  Dr. Lavone Neri on Friday     Lavone Neri, [facility] Physician): [facility], [address] [place], [place] [address], [place]:  Total Time: 30 minutes or less   Electronic Signatures: Linus Mako (MD)  (Signed 13-Jan-16 18:21)  Authored: ADMISSION DATE AND DIAGNOSIS, CHIEF COMPLAINT/HPI, Allergies, PERTINENT RADIOLOGY STUDIES, PERTINENT PAST HISTORY, HOSPITAL COURSE, DISCHARGE INSTRUCTIONS HOME MEDS, PATIENT INSTRUCTIONS, Follow Up Physician, TIME SPENT  Last Updated: 13-Jan-16 18:21 by Linus Mako (MD)

## 2015-02-09 ENCOUNTER — Other Ambulatory Visit: Payer: Self-pay

## 2015-02-09 ENCOUNTER — Inpatient Hospital Stay (HOSPITAL_BASED_OUTPATIENT_CLINIC_OR_DEPARTMENT_OTHER): Payer: 59 | Admitting: Hematology and Oncology

## 2015-02-09 ENCOUNTER — Encounter (INDEPENDENT_AMBULATORY_CARE_PROVIDER_SITE_OTHER): Payer: Self-pay

## 2015-02-09 ENCOUNTER — Inpatient Hospital Stay: Payer: 59 | Attending: Hematology and Oncology

## 2015-02-09 ENCOUNTER — Encounter: Payer: Self-pay | Admitting: Hematology and Oncology

## 2015-02-09 VITALS — BP 143/82 | HR 76 | Temp 96.3°F | Ht 73.0 in | Wt 243.8 lb

## 2015-02-09 DIAGNOSIS — C9140 Hairy cell leukemia not having achieved remission: Secondary | ICD-10-CM | POA: Diagnosis not present

## 2015-02-09 DIAGNOSIS — Z79899 Other long term (current) drug therapy: Secondary | ICD-10-CM | POA: Diagnosis not present

## 2015-02-09 DIAGNOSIS — R161 Splenomegaly, not elsewhere classified: Secondary | ICD-10-CM | POA: Insufficient documentation

## 2015-02-09 HISTORY — DX: Hairy cell leukemia not having achieved remission: C91.40

## 2015-02-09 LAB — BASIC METABOLIC PANEL
Anion gap: 10 (ref 5–15)
BUN: 25 mg/dL — ABNORMAL HIGH (ref 6–20)
CO2: 23 mmol/L (ref 22–32)
Calcium: 9.3 mg/dL (ref 8.9–10.3)
Chloride: 108 mmol/L (ref 101–111)
Creatinine, Ser: 1.21 mg/dL (ref 0.61–1.24)
GFR calc Af Amer: >60 mL/min (ref >60)
GFR calc non Af Amer: >60 mL/min (ref >60)
Glucose, Bld: 143 mg/dL — ABNORMAL HIGH (ref 65–99)
Potassium: 3.7 mmol/L (ref 3.5–5.1)
Sodium: 141 mmol/L (ref 135–145)

## 2015-02-09 LAB — CBC WITH DIFFERENTIAL/PLATELET
Basophils Absolute: 0 10*3/uL (ref 0–0.1)
Basophils Relative: 1 %
Eosinophils Absolute: 0.1 10*3/uL (ref 0–0.7)
Eosinophils Relative: 3 %
HCT: 46.9 % (ref 40.0–52.0)
Hemoglobin: 15.9 g/dL (ref 13.0–18.0)
Lymphocytes Relative: 16 %
Lymphs Abs: 0.6 10*3/uL — ABNORMAL LOW (ref 1.0–3.6)
MCH: 29.4 pg (ref 26.0–34.0)
MCHC: 33.8 g/dL (ref 32.0–36.0)
MCV: 86.8 fL (ref 80.0–100.0)
Monocytes Absolute: 0.2 10*3/uL (ref 0.2–1.0)
Monocytes Relative: 6 %
Neutro Abs: 2.8 10*3/uL (ref 1.4–6.5)
Neutrophils Relative %: 74 %
Platelets: 110 10*3/uL — ABNORMAL LOW (ref 150–440)
RBC: 5.4 MIL/uL (ref 4.40–5.90)
RDW: 13.9 % (ref 11.5–14.5)
WBC: 3.8 10*3/uL (ref 3.8–10.6)

## 2015-02-09 MED ORDER — VALACYCLOVIR HCL 500 MG PO TABS: 500.0000 mg | ORAL_TABLET | Freq: Every day | ORAL | Status: DC

## 2015-02-09 NOTE — Progress Notes (Signed)
Pt here today for follow up regarding hairy cell leukemia; c/o lower left abdominal pain

## 2015-02-09 NOTE — Progress Notes (Signed)
[facility] day:  02/09/2015  Chief Complaint: Kenneth Friedman. is an 39 y.o. male with hairy cell leukemia currently day 133 status post cladribine who is seen for 1 month assessment.  HPI: The patient was last medical oncology clinic on 01/11/2015. At that time he felt that his energy level was a 6-7 out of 10. He noted some abdominal tenderness status post a cholecystectomy.  He described having some "brutal gas pains".  He was eating small frequent meals. He denied any diarrhea.  He had no issues with fevers or infections.  He was on his Bactrim and valacyclovir prophylaxis.  Counts at that time included a hematocrit of 46.1, hemoglobin 15.9, white count 4700 with an ANC of 3400.  Absolute lymphocyte count was 600.  During the interim he has done well. He is back at the gym working out. He is more active. He does note an episode of left-sided pain which occurred on 02/04/2015 and lasted for 2 days. He describes the pain as nagging (3-4 out of 10). Pain was not affected by what he ate. He states that he's been eating healthier.  He denies any gas pain. Bowel movements are normal. He has had no recurrence of this pain.  Past Medical History  Diagnosis Date  . Hairy cell leukemia   . Hairy cell leukemia 02/09/2015  . Allergy     seasonal    Past Surgical History  Procedure Laterality Date  . Rotator cuff repair Right 2009, 2010  . Hernia repair  2011  . Inguinal hernia repair    . Cholecystectomy  12/15/14    Family History  Problem Relation Age of Onset  . Hypertension Mother   . Hypertension Father   . Cancer Paternal Aunt   . Cancer Paternal Grandmother   . Cancer Paternal Grandfather     Social History:  reports that he has never smoked. He has never used smokeless tobacco. He reports that he drinks alcohol. He reports that he does not use illicit drugs.  The patient is alone today.  Allergies:  Allergies  Allergen Reactions   . Diclofenac Other (See Comments)    GI DISTRESS  . Fentanyl Other (See Comments)    Low tolerance   Current Medications: Current Outpatient Prescriptions  Medication Sig Dispense Refill  . calcium carbonate (TUMS - DOSED IN MG ELEMENTAL CALCIUM) 500 MG chewable tablet Chew 1 tablet by mouth 3 (three) times daily.    Marland Kitchen ibuprofen (ADVIL,MOTRIN) 200 MG tablet Take 200 mg by mouth every 6 (six) hours as needed.    . Multiple Vitamin (MULTIVITAMIN) tablet Take 1 tablet by mouth daily.    Marland Kitchen sulfamethoxazole-trimethoprim (BACTRIM DS,SEPTRA DS) 800-160 MG per tablet Take 1 tablet by mouth 2 (two) times daily. 1 tab Tuesday Thursday adn Saturdays    . valACYclovir (VALTREX) 500 MG tablet Take 1 tablet (500 mg total) by mouth daily. 30 tablet 2  . oxyCODONE-acetaminophen (PERCOCET/ROXICET) 5-325 MG per tablet Take 1 tablet by mouth every 4 (four) hours as needed for severe pain.     No current facility-administered medications for this visit.   Review of Systems:  GENERAL:  Feels pretty good.  Active.  No fevers, sweats or weight loss. PERFORMANCE STATUS (ECOG):  1 HEENT:  No visual changes, runny nose, sore throat, mouth sores or tenderness. Lungs: No shortness of breath or cough.  No hemoptysis. Cardiac:  No chest pain, palpitations, orthopnea, or PND. GI:  Transient left sided abdominal pain (see HPI).  No nausea, vomiting, diarrhea, constipation, melena or hematochezia. GU:  No urgency, frequency, dysuria, or hematuria. Musculoskeletal:  No back pain.  No joint pain.  No muscle tenderness. Extremities:  No pain or swelling. Skin:  No rashes or skin changes. Neuro:  No headache, numbness or weakness, balance or coordination issues. Endocrine:  No diabetes, thyroid issues, hot flashes or night sweats. Psych:  No mood changes, depression or anxiety. Pain:  No focal pain. Review of systems:  All other systems reviewed and found to be negative.   Physical Exam: Blood pressure 143/82, pulse  76, temperature 96.3 F (35.7 C), temperature source Tympanic, height 6\' 1"  (1.854 m), weight 243 lb 13.3 oz (110.6 kg). GENERAL:  Well developed, well nourished, muscular gentleman sitting comfortably in the exam room in no acute distress. MENTAL STATUS:  Alert and oriented to person, place and time. HEAD:  Alopecia.  Short beard.  Normocephalic, atraumatic, face symmetric, no Cushingoid features. EYES:  Pupils equal round and reactive to light and accomodation.  No conjunctivitis or scleral icterus. ENT:  Oropharynx clear without lesion.  Tongue normal. Mucous membranes moist.  RESPIRATORY:  Clear to auscultation without rales, wheezes or rhonchi. CARDIOVASCULAR:  Regular rate and rhythm without murmur, rub or gallop. ABDOMEN:  Soft, non-tender, with active bowel sounds, and no hepatomegaly.  Spleen tip barely palpable.  No masses.  No guarding or rebound tenderness. SKIN:  No rashes, ulcers or lesions. EXTREMITIES: No edema, no skin discoloration or tenderness.  No palpable cords. LYMPH NODES: No palpable cervical, supraclavicular, axillary or inguinal adenopathy  NEUROLOGICAL: Unremarkable. PSYCH:  Appropriate.  Appointment on 02/09/2015  Component Date Value Ref Range Status  . WBC 02/09/2015 3.8  3.8 - 10.6 K/uL Final  . RBC 02/09/2015 5.40  4.40 - 5.90 MIL/uL Final  . Hemoglobin 02/09/2015 15.9  13.0 - 18.0 g/dL Final  . HCT 02/09/2015 46.9  40.0 - 52.0 % Final  . MCV 02/09/2015 86.8  80.0 - 100.0 fL Final  . MCH 02/09/2015 29.4  26.0 - 34.0 pg Final  . MCHC 02/09/2015 33.8  32.0 - 36.0 g/dL Final  . RDW 02/09/2015 13.9  11.5 - 14.5 % Final  . Platelets 02/09/2015 110* 150 - 440 K/uL Final  . Neutrophils Relative % 02/09/2015 74   Final  . Neutro Abs 02/09/2015 2.8  1.4 - 6.5 K/uL Final  . Lymphocytes Relative 02/09/2015 16   Final  . Lymphs Abs 02/09/2015 0.6* 1.0 - 3.6 K/uL Final  . Monocytes Relative 02/09/2015 6   Final  . Monocytes Absolute 02/09/2015 0.2  0.2 - 1.0 K/uL  Final  . Eosinophils Relative 02/09/2015 3   Final  . Eosinophils Absolute 02/09/2015 0.1  0 - 0.7 K/uL Final  . Basophils Relative 02/09/2015 1   Final  . Basophils Absolute 02/09/2015 0.0  0 - 0.1 K/uL Final   Assessment:  Kenneth Friedman. is an 39 y.o. male gentleman with hairy cell leukemia.  He presented with symptomatic splenomegaly and mild leukopenia and thrombocytopenia.  He is currently day 133 s/p cladribine (09/30/2014 - 10/07/2014).  He is on prophylactic Septra and valacyclovir.  Counts continue to slowly improve.    He has a history of severe abdominal pain. Abdominal and pelvic CT scan on 11/30/2014 revealed chronic cholelithiasis. He underwent cholecystectomy on 12/15/2014.    Symptomatically, he is more active.  He is back to work. He is eating healthier. He had had an episode  of left upper quadrant nagging pain on 02/04/2015.  Spleen tip is barely palpable.  Counts are stable.  Plan: 1. Labs today:  CBC with diff, CMP. 2. Discuss unclear etiology of abdominal pain.  If recurs, consider follow-up CT scan. 3. Discontinue Bactrim. 4. Continue valacyclovir until patient no longer lymphopenic.  Refill x 2. 5. RTC in 2 months for MD assessment and labs (CBC with diff, CMP).  Lequita Asal, MD  02/09/2015, 10:09 AM

## 2015-02-23 ENCOUNTER — Other Ambulatory Visit: Payer: 59

## 2015-02-23 ENCOUNTER — Ambulatory Visit: Payer: 59 | Admitting: Hematology and Oncology

## 2015-04-13 ENCOUNTER — Inpatient Hospital Stay: Payer: 59 | Attending: Hematology and Oncology | Admitting: Hematology and Oncology

## 2015-04-13 ENCOUNTER — Inpatient Hospital Stay: Payer: 59

## 2015-04-13 ENCOUNTER — Encounter: Payer: Self-pay | Admitting: Hematology and Oncology

## 2015-04-13 VITALS — BP 129/83 | HR 74 | Temp 96.8°F | Resp 18 | Wt 241.2 lb

## 2015-04-13 DIAGNOSIS — C9140 Hairy cell leukemia not having achieved remission: Secondary | ICD-10-CM

## 2015-04-13 DIAGNOSIS — Z79899 Other long term (current) drug therapy: Secondary | ICD-10-CM | POA: Diagnosis not present

## 2015-04-13 LAB — COMPREHENSIVE METABOLIC PANEL
ALT: 22 U/L (ref 17–63)
AST: 27 U/L (ref 15–41)
Albumin: 4.4 g/dL (ref 3.5–5.0)
Alkaline Phosphatase: 81 U/L (ref 38–126)
Anion gap: 9 (ref 5–15)
BUN: 20 mg/dL (ref 6–20)
CO2: 23 mmol/L (ref 22–32)
Calcium: 9.1 mg/dL (ref 8.9–10.3)
Chloride: 106 mmol/L (ref 101–111)
Creatinine, Ser: 1.24 mg/dL (ref 0.61–1.24)
GFR calc Af Amer: >60 mL/min (ref >60)
GFR calc non Af Amer: >60 mL/min (ref >60)
Glucose, Bld: 97 mg/dL (ref 65–99)
Potassium: 3.8 mmol/L (ref 3.5–5.1)
Sodium: 138 mmol/L (ref 135–145)
Total Bilirubin: 1.2 mg/dL (ref 0.3–1.2)
Total Protein: 7.8 g/dL (ref 6.5–8.1)

## 2015-04-13 LAB — CBC WITH DIFFERENTIAL/PLATELET
Basophils Absolute: 0.1 10*3/uL (ref 0–0.1)
Basophils Relative: 1 %
Eosinophils Absolute: 0.2 10*3/uL (ref 0–0.7)
Eosinophils Relative: 4 %
HCT: 48.5 % (ref 40.0–52.0)
Hemoglobin: 16.6 g/dL (ref 13.0–18.0)
Lymphocytes Relative: 18 %
Lymphs Abs: 0.8 10*3/uL — ABNORMAL LOW (ref 1.0–3.6)
MCH: 29.8 pg (ref 26.0–34.0)
MCHC: 34.1 g/dL (ref 32.0–36.0)
MCV: 87.4 fL (ref 80.0–100.0)
Monocytes Absolute: 0.3 10*3/uL (ref 0.2–1.0)
Monocytes Relative: 7 %
Neutro Abs: 3.3 10*3/uL (ref 1.4–6.5)
Neutrophils Relative %: 70 %
Platelets: 125 10*3/uL — ABNORMAL LOW (ref 150–440)
RBC: 5.56 MIL/uL (ref 4.40–5.90)
RDW: 14.2 % (ref 11.5–14.5)
WBC: 4.6 10*3/uL (ref 3.8–10.6)

## 2015-04-13 NOTE — Progress Notes (Signed)
[facility] day:  04/13/2015  Chief Complaint: Kenneth Friedman. is an 39 y.o. 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  HPI: The patient was last seen in the medical oncology clinic on 02/09/2015. At that time, he was more active.  He was back to work.  He was eating healthier.   He had had an episode of left upper quadrant nagging pain on 02/04/2015. Spleen tip was barely palpable. CBC revealed a hematocrit of 46.9, hemoglobin 15.9, MCV 86.8, platelets 110,000, WBC 3800 with an ANC of 2800. His Bactrim was discontinued.  His valacyclovir was continued.  During the interim, he has felt pretty good.  He notes some days he is more fatigued than others.  He denies any fever, infections, bruising or bleeding.  Past Medical History  Diagnosis Date  . Hairy cell leukemia   . Hairy cell leukemia 02/09/2015  . Allergy     seasonal    Past Surgical History  Procedure Laterality Date  . Rotator cuff repair Right 2009, 2010  . Hernia repair  2011  . Inguinal hernia repair    . Cholecystectomy  12/15/14    Family History  Problem Relation Age of Onset  . Hypertension Mother   . Hypertension Father   . Cancer Paternal Aunt   . Cancer Paternal Grandmother   . Cancer Paternal Grandfather     Social History:  reports that he has never smoked. He has never used smokeless tobacco. He reports that he drinks alcohol. He reports that he does not use illicit drugs.  He is going to [employer] in 2 weeks.  The patient is accompanied by his wife today.  Allergies:  Allergies  Allergen Reactions  . Diclofenac Other (See Comments)    GI DISTRESS  . Fentanyl Other (See Comments)    Low tolerance   Current Medications: Current Outpatient Prescriptions  Medication Sig Dispense Refill  . calcium carbonate (TUMS - DOSED IN MG ELEMENTAL CALCIUM) 500 MG chewable tablet Chew 1  tablet by mouth 2 (two) times daily as needed.     . Creatine 5000 MG PACK Take 5,000 mg by mouth.    Marland Kitchen ibuprofen (ADVIL,MOTRIN) 200 MG tablet Take 200 mg by mouth every 6 (six) hours as needed.    . Multiple Vitamin (MULTIVITAMIN) tablet Take 1 tablet by mouth daily.    . valACYclovir (VALTREX) 500 MG tablet Take 1 tablet (500 mg total) by mouth daily. 30 tablet 2  . oxyCODONE-acetaminophen (PERCOCET/ROXICET) 5-325 MG per tablet Take 1 tablet by mouth every 4 (four) hours as needed for severe pain.    Marland Kitchen sulfamethoxazole-trimethoprim (BACTRIM DS,SEPTRA DS) 800-160 MG per tablet Take 1 tablet by mouth 2 (two) times daily. 1 tab Tuesday Thursday adn Saturdays     No current facility-administered medications for this visit.   Review of Systems:  GENERAL:  Feels good.  Some days fatigued.  No fevers, sweats or weight loss. PERFORMANCE STATUS (ECOG):  1 HEENT:  No visual changes, runny nose, sore throat, mouth sores or tenderness. Lungs: No shortness of breath or cough.  No hemoptysis. Cardiac:  No chest pain, palpitations, orthopnea, or PND. GI:  No abdominal pain.  No nausea, vomiting, diarrhea, constipation, melena or hematochezia. GU:  No urgency, frequency, dysuria, or hematuria. Musculoskeletal:  No back pain.  No joint pain.  No muscle tenderness. Extremities:  No pain or swelling.  Skin:  No rashes or skin changes. Neuro:  No headache, numbness or weakness, balance or coordination issues. Endocrine:  No diabetes, thyroid issues, hot flashes or night sweats. Psych:  No mood changes, depression or anxiety. Pain:  No focal pain. Review of systems:  All other systems reviewed and found to be negative.   Physical Exam: Blood pressure 129/83, pulse 74, temperature 96.8 F (36 C), temperature source Tympanic, resp. rate 18, weight 241 lb 2.9 oz (109.4 kg), SpO2 97 %. GENERAL:  Well developed, well nourished, muscular gentleman sitting comfortably in the exam room in no acute  distress. MENTAL STATUS:  Alert and oriented to person, place and time. HEAD:  Alopecia.  Short beard.  Normocephalic, atraumatic, face symmetric, no Cushingoid features. EYES:  Pupils equal round and reactive to light and accomodation.  No conjunctivitis or scleral icterus. ENT:  Oropharynx clear without lesion.  Tongue normal. Mucous membranes moist.  RESPIRATORY:  Clear to auscultation without rales, wheezes or rhonchi. CARDIOVASCULAR:  Regular rate and rhythm without murmur, rub or gallop. ABDOMEN:  Soft, non-tender, with active bowel sounds, and no hepatomegaly.  Spleen tip barely palpable.  No masses.  No guarding or rebound tenderness. SKIN:  No rashes, ulcers or lesions. EXTREMITIES: No edema, no skin discoloration or tenderness.  No palpable cords. LYMPH NODES: No palpable cervical, supraclavicular, axillary or inguinal adenopathy  NEUROLOGICAL: Unremarkable. PSYCH:  Appropriate.  Appointment on 04/13/2015  Component Date Value Ref Range Status  . WBC 04/13/2015 4.6  3.8 - 10.6 K/uL Final  . RBC 04/13/2015 5.56  4.40 - 5.90 MIL/uL Final  . Hemoglobin 04/13/2015 16.6  13.0 - 18.0 g/dL Final  . HCT 04/13/2015 48.5  40.0 - 52.0 % Final  . MCV 04/13/2015 87.4  80.0 - 100.0 fL Final  . MCH 04/13/2015 29.8  26.0 - 34.0 pg Final  . MCHC 04/13/2015 34.1  32.0 - 36.0 g/dL Final  . RDW 04/13/2015 14.2  11.5 - 14.5 % Final  . Platelets 04/13/2015 125* 150 - 440 K/uL Final  . Neutrophils Relative % 04/13/2015 70   Final  . Neutro Abs 04/13/2015 3.3  1.4 - 6.5 K/uL Final  . Lymphocytes Relative 04/13/2015 18   Final  . Lymphs Abs 04/13/2015 0.8* 1.0 - 3.6 K/uL Final  . Monocytes Relative 04/13/2015 7   Final  . Monocytes Absolute 04/13/2015 0.3  0.2 - 1.0 K/uL Final  . Eosinophils Relative 04/13/2015 4   Final  . Eosinophils Absolute 04/13/2015 0.2  0 - 0.7 K/uL Final  . Basophils Relative 04/13/2015 1   Final  . Basophils Absolute 04/13/2015 0.1  0 - 0.1 K/uL Final   Assessment:   Kenneth Friedman. is an 39 y.o. male gentleman with hairy cell leukemia.  He presented with symptomatic splenomegaly and mild leukopenia and thrombocytopenia.  He is approximately 6 months s/p cladribine (09/30/2014 - 10/07/2014).  He is on prophylactic valacyclovir.     He has a history of severe abdominal pain. Abdominal and pelvic CT scan on 11/30/2014 revealed chronic cholelithiasis. He underwent cholecystectomy on 12/15/2014.    Symptomatically, he feels good.  He is fatigued on some day.  Spleen tip is barely palpable.  Counts are slowly improving.  Plan: 1. Labs today:  CBC with diff, CMP. 2.  Continue valacyclovir until patient no longer lymphopenic. 3.  Release for dentist and [employer]. 4.  RTC in 3 months for MD assessment and labs (CBC with diff, CMP).   Lequita Asal,  MD  04/13/2015, 11:05 AM

## 2015-04-13 NOTE — Progress Notes (Signed)
Patient here for follow up for Hairy Cell Leukemia. States he has chronic lower pain 5/10. Also has stinging occasionally in nose. Denies any bleeding or drainage from nowe

## 2015-05-18 ENCOUNTER — Other Ambulatory Visit: Payer: Self-pay | Admitting: *Deleted

## 2015-05-18 MED ORDER — VALACYCLOVIR HCL 500 MG PO TABS: 500.0000 mg | ORAL_TABLET | Freq: Every day | ORAL | Status: DC

## 2015-07-13 ENCOUNTER — Other Ambulatory Visit: Payer: Self-pay

## 2015-07-13 DIAGNOSIS — C9140 Hairy cell leukemia not having achieved remission: Secondary | ICD-10-CM

## 2015-07-14 ENCOUNTER — Inpatient Hospital Stay: Payer: 59 | Attending: Hematology and Oncology

## 2015-07-14 ENCOUNTER — Inpatient Hospital Stay (HOSPITAL_BASED_OUTPATIENT_CLINIC_OR_DEPARTMENT_OTHER): Payer: 59 | Admitting: Hematology and Oncology

## 2015-07-14 ENCOUNTER — Telehealth: Payer: Self-pay | Admitting: *Deleted

## 2015-07-14 VITALS — BP 130/75 | HR 70 | Temp 96.8°F | Resp 18 | Ht 73.0 in | Wt 246.4 lb

## 2015-07-14 DIAGNOSIS — Z9049 Acquired absence of other specified parts of digestive tract: Secondary | ICD-10-CM | POA: Diagnosis not present

## 2015-07-14 DIAGNOSIS — Z79899 Other long term (current) drug therapy: Secondary | ICD-10-CM

## 2015-07-14 DIAGNOSIS — C9140 Hairy cell leukemia not having achieved remission: Secondary | ICD-10-CM | POA: Diagnosis present

## 2015-07-14 DIAGNOSIS — R21 Rash and other nonspecific skin eruption: Secondary | ICD-10-CM | POA: Diagnosis not present

## 2015-07-14 DIAGNOSIS — R161 Splenomegaly, not elsewhere classified: Secondary | ICD-10-CM | POA: Insufficient documentation

## 2015-07-14 LAB — CBC WITH DIFFERENTIAL/PLATELET
Basophils Absolute: 0 10*3/uL (ref 0–0.1)
Basophils Relative: 1 %
Eosinophils Absolute: 0.3 10*3/uL (ref 0–0.7)
Eosinophils Relative: 6 %
HCT: 45.9 % (ref 40.0–52.0)
Hemoglobin: 16 g/dL (ref 13.0–18.0)
Lymphocytes Relative: 19 %
Lymphs Abs: 1.1 10*3/uL (ref 1.0–3.6)
MCH: 30.5 pg (ref 26.0–34.0)
MCHC: 34.7 g/dL (ref 32.0–36.0)
MCV: 87.8 fL (ref 80.0–100.0)
Monocytes Absolute: 0.4 10*3/uL (ref 0.2–1.0)
Monocytes Relative: 6 %
Neutro Abs: 4 10*3/uL (ref 1.4–6.5)
Neutrophils Relative %: 68 %
Platelets: 117 10*3/uL — ABNORMAL LOW (ref 150–440)
RBC: 5.23 MIL/uL (ref 4.40–5.90)
RDW: 13.6 % (ref 11.5–14.5)
WBC: 5.8 10*3/uL (ref 3.8–10.6)

## 2015-07-14 LAB — COMPREHENSIVE METABOLIC PANEL
ALT: 25 U/L (ref 17–63)
AST: 25 U/L (ref 15–41)
Albumin: 4.5 g/dL (ref 3.5–5.0)
Alkaline Phosphatase: 78 U/L (ref 38–126)
Anion gap: 9 (ref 5–15)
BUN: 22 mg/dL — ABNORMAL HIGH (ref 6–20)
CO2: 27 mmol/L (ref 22–32)
Calcium: 9.4 mg/dL (ref 8.9–10.3)
Chloride: 103 mmol/L (ref 101–111)
Creatinine, Ser: 1.39 mg/dL — ABNORMAL HIGH (ref 0.61–1.24)
GFR calc Af Amer: >60 mL/min (ref >60)
GFR calc non Af Amer: >60 mL/min (ref >60)
Glucose, Bld: 110 mg/dL — ABNORMAL HIGH (ref 65–99)
Potassium: 4.1 mmol/L (ref 3.5–5.1)
Sodium: 139 mmol/L (ref 135–145)
Total Bilirubin: 1 mg/dL (ref 0.3–1.2)
Total Protein: 7.8 g/dL (ref 6.5–8.1)

## 2015-07-14 NOTE — Progress Notes (Signed)
Pt here today related to itching all over body.  Not localized.  Does itch.

## 2015-07-14 NOTE — Progress Notes (Signed)
[facility] day:  07/14/2015  Chief Complaint: Amareon Phung. is an 39 y.o. 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  HPI: The patient was last seen in the medical oncology clinic on 04/13/2015. At that time, he was doing well.  Counts included a hematocrit of 48.5, hemoglobin 16.6, platelets 125,000, WBC 4600 with an ANC of 3300.  Comprehensive metabolic panel was normal.  During the interim, he has done well. He notes a rash that started on Monday, 07/12/2015.  Rash came up on his shoulders and lower back.  Rash is pruritic.  He has spots on his legs.  He denies any new medications, soaps, detergents or colognes.  He had fish on Monday and ate out at [facility]  Past Medical History  Diagnosis Date  . Hairy cell leukemia   . Hairy cell leukemia 02/09/2015  . Allergy     seasonal    Past Surgical History  Procedure Laterality Date  . Rotator cuff repair Right 2009, 2010  . Hernia repair  2011  . Inguinal hernia repair    . Cholecystectomy  12/15/14    Family History  Problem Relation Age of Onset  . Hypertension Mother   . Hypertension Father   . Cancer Paternal Aunt   . Cancer Paternal Grandmother   . Cancer Paternal Grandfather     Social History:  reports that he has never smoked. He has never used smokeless tobacco. He reports that he drinks alcohol. He reports that he does not use illicit drugs.  The patient is alone today.  Allergies:  Allergies  Allergen Reactions  . Diclofenac Other (See Comments)    GI DISTRESS  . Fentanyl Other (See Comments)    Low tolerance   Current Medications: Current Outpatient Prescriptions  Medication Sig Dispense Refill  . calcium carbonate (TUMS - DOSED IN MG ELEMENTAL CALCIUM) 500 MG chewable tablet Chew 1 tablet by mouth 2 (two) times daily as needed.     . Creatine 5000 MG PACK Take 5,000 mg by mouth.    Marland Kitchen  ibuprofen (ADVIL,MOTRIN) 200 MG tablet Take 200 mg by mouth every 6 (six) hours as needed.    . Multiple Vitamin (MULTIVITAMIN) tablet Take 1 tablet by mouth daily.    Marland Kitchen oxyCODONE-acetaminophen (PERCOCET/ROXICET) 5-325 MG per tablet Take 1 tablet by mouth every 4 (four) hours as needed for severe pain.    Marland Kitchen sulfamethoxazole-trimethoprim (BACTRIM DS,SEPTRA DS) 800-160 MG per tablet Take 1 tablet by mouth 2 (two) times daily. 1 tab Tuesday Thursday adn Saturdays    . valACYclovir (VALTREX) 500 MG tablet Take 1 tablet (500 mg total) by mouth daily. 30 tablet 1   No current facility-administered medications for this visit.   Review of Systems:  GENERAL:  Feels good.  Active.  No fevers, sweats or weight loss. PERFORMANCE STATUS (ECOG):  1 HEENT:  No visual changes, runny nose, sore throat, mouth sores or tenderness. Lungs: No shortness of breath or cough.  No hemoptysis. Cardiac:  No chest pain, palpitations, orthopnea, or PND. GI:  No nausea, vomiting, diarrhea, constipation, melena or hematochezia. GU:  No urgency, frequency, dysuria, or hematuria. Musculoskeletal:  No back pain.  No joint pain.  No muscle tenderness. Extremities:  No pain or swelling. Skin:  Pruritic rash (see HPI).   Neuro:  No headache, numbness or weakness, balance or coordination issues. Endocrine:  No diabetes, thyroid  issues, hot flashes or night sweats. Psych:  No mood changes, depression or anxiety. Pain:  No focal pain. Review of systems:  All other systems reviewed and found to be negative.   Physical Exam: Blood pressure 130/75, pulse 70, temperature 96.8 F (36 C), temperature source Tympanic, resp. rate 18, weight 246 pounds 5.8 oz (111.75 kg) GENERAL:  Well developed, well nourished, muscular gentleman sitting comfortably in the exam room in no acute distress. MENTAL STATUS:  Alert and oriented to person, place and time. HEAD:  Alopecia.  Short beard.  Normocephalic, atraumatic, face symmetric, no  Cushingoid features. EYES:  Pupils equal round and reactive to light and accomodation.  No conjunctivitis or scleral icterus. ENT:  Oropharynx clear without lesion.  Tongue normal. Mucous membranes moist.  RESPIRATORY:  Clear to auscultation without rales, wheezes or rhonchi. CARDIOVASCULAR:  Regular rate and rhythm without murmur, rub or gallop. ABDOMEN:  Soft, non-tender, with active bowel sounds, and no hepatomegaly. SKIN:  Splotchy erythematous, barely palpable rash with excoriations. EXTREMITIES: No edema, no skin discoloration or tenderness.  No palpable cords. LYMPH NODES: No palpable cervical, supraclavicular, axillary or inguinal adenopathy  NEUROLOGICAL: Unremarkable. PSYCH:  Appropriate.  Appointment on 07/14/2015  Component Date Value Ref Range Status  . WBC 07/14/2015 5.8  3.8 - 10.6 K/uL Final  . RBC 07/14/2015 5.23  4.40 - 5.90 MIL/uL Final  . Hemoglobin 07/14/2015 16.0  13.0 - 18.0 g/dL Final  . HCT 07/14/2015 45.9  40.0 - 52.0 % Final  . MCV 07/14/2015 87.8  80.0 - 100.0 fL Final  . MCH 07/14/2015 30.5  26.0 - 34.0 pg Final  . MCHC 07/14/2015 34.7  32.0 - 36.0 g/dL Final  . RDW 07/14/2015 13.6  11.5 - 14.5 % Final  . Platelets 07/14/2015 117* 150 - 440 K/uL Final  . Neutrophils Relative % 07/14/2015 68   Final  . Neutro Abs 07/14/2015 4.0  1.4 - 6.5 K/uL Final  . Lymphocytes Relative 07/14/2015 19   Final  . Lymphs Abs 07/14/2015 1.1  1.0 - 3.6 K/uL Final  . Monocytes Relative 07/14/2015 6   Final  . Monocytes Absolute 07/14/2015 0.4  0.2 - 1.0 K/uL Final  . Eosinophils Relative 07/14/2015 6   Final  . Eosinophils Absolute 07/14/2015 0.3  0 - 0.7 K/uL Final  . Basophils Relative 07/14/2015 1   Final  . Basophils Absolute 07/14/2015 0.0  0 - 0.1 K/uL Final  . Sodium 07/14/2015 139  135 - 145 mmol/L Final  . Potassium 07/14/2015 4.1  3.5 - 5.1 mmol/L Final  . Chloride 07/14/2015 103  101 - 111 mmol/L Final  . CO2 07/14/2015 27  22 - 32 mmol/L Final  . Glucose, Bld  07/14/2015 110* 65 - 99 mg/dL Final  . BUN 07/14/2015 22* 6 - 20 mg/dL Final  . Creatinine, Ser 07/14/2015 1.39* 0.61 - 1.24 mg/dL Final  . Calcium 07/14/2015 9.4  8.9 - 10.3 mg/dL Final  . Total Protein 07/14/2015 7.8  6.5 - 8.1 g/dL Final  . Albumin 07/14/2015 4.5  3.5 - 5.0 g/dL Final  . AST 07/14/2015 25  15 - 41 U/L Final  . ALT 07/14/2015 25  17 - 63 U/L Final  . Alkaline Phosphatase 07/14/2015 78  38 - 126 U/L Final  . Total Bilirubin 07/14/2015 1.0  0.3 - 1.2 mg/dL Final  . GFR calc non Af Amer 07/14/2015 >60  >60 mL/min Final  . GFR calc Af Amer 07/14/2015 >60  >60 mL/min Final  Comment: (NOTE) The eGFR has been calculated using the CKD EPI equation. This calculation has not been validated in all clinical situations. eGFR's persistently <60 mL/min signify possible Chronic Kidney Disease.   Georgiann Hahn gap 07/14/2015 9  5 - 15 Final   Assessment:  Sandria Senter. is an 39 y.o. male gentleman with hairy cell leukemia.  He presented with symptomatic splenomegaly and mild leukopenia and thrombocytopenia.  He is currently day 9 months s/p cladribine (09/30/2014 - 10/07/2014).  He is on prophylactic valacyclovir.  Counts continue to slowly improve.    He has a history of severe abdominal pain. Abdominal and pelvic CT scan on 11/30/2014 revealed chronic cholelithiasis. He underwent cholecystectomy on 12/15/2014.    Symptomatically, he has a new palpable erythematous rash.    He denies any new soaps, lotions, or detergents.  He has eaten out recently.  Plan: 1. Labs today:  CBC with diff, CMP. 2. Discuss rash.  Possibly food allergy.  Discuss symptoms management. 3. Dermatology referral in [place]. 4. RTC in 3 months for MD assessment and labs (CBC with diff, CMP).   Lequita Asal, MD  07/14/2015, 11:50 AM

## 2015-07-14 NOTE — Telephone Encounter (Signed)
Dr Mike Gip said she will see him now. He agrees to be on his way to [facility] now

## 2015-07-15 ENCOUNTER — Inpatient Hospital Stay: Payer: 59

## 2015-07-15 ENCOUNTER — Inpatient Hospital Stay: Payer: 59 | Admitting: Hematology and Oncology

## 2015-07-16 ENCOUNTER — Telehealth: Payer: Self-pay | Admitting: *Deleted

## 2015-07-16 MED ORDER — VALACYCLOVIR HCL 500 MG PO TABS: 500.0000 mg | ORAL_TABLET | Freq: Every day | ORAL | Status: DC

## 2015-07-16 NOTE — Telephone Encounter (Signed)
Escribed

## 2015-07-26 ENCOUNTER — Emergency Department
Admission: EM | Admit: 2015-07-26 | Discharge: 2015-07-26 | Disposition: A | Discharge status: Home / Self Care | Payer: 59

## 2015-08-16 ENCOUNTER — Telehealth: Payer: Self-pay | Admitting: *Deleted

## 2015-08-16 NOTE — Telephone Encounter (Signed)
Patient should have refill on current rx. Left VM for him regarding this

## 2015-10-10 ENCOUNTER — Encounter: Payer: Self-pay | Admitting: Hematology and Oncology

## 2015-10-11 ENCOUNTER — Inpatient Hospital Stay: Payer: 59 | Admitting: Hematology and Oncology

## 2015-10-11 ENCOUNTER — Inpatient Hospital Stay: Payer: 59

## 2015-10-26 ENCOUNTER — Inpatient Hospital Stay: Payer: 59 | Attending: Hematology and Oncology | Admitting: Hematology and Oncology

## 2015-10-26 ENCOUNTER — Inpatient Hospital Stay: Payer: 59

## 2015-10-26 VITALS — BP 124/76 | HR 61 | Temp 96.4°F | Resp 18 | Ht 73.0 in | Wt 244.9 lb

## 2015-10-26 DIAGNOSIS — Z79899 Other long term (current) drug therapy: Secondary | ICD-10-CM

## 2015-10-26 DIAGNOSIS — Z9221 Personal history of antineoplastic chemotherapy: Secondary | ICD-10-CM

## 2015-10-26 DIAGNOSIS — C9140 Hairy cell leukemia not having achieved remission: Secondary | ICD-10-CM

## 2015-10-26 LAB — CBC WITH DIFFERENTIAL/PLATELET
Basophils Absolute: 0 10*3/uL (ref 0–0.1)
Basophils Relative: 1 %
Eosinophils Absolute: 0.2 10*3/uL (ref 0–0.7)
Eosinophils Relative: 4 %
HCT: 46.6 % (ref 40.0–52.0)
Hemoglobin: 16.6 g/dL (ref 13.0–18.0)
Lymphocytes Relative: 20 %
Lymphs Abs: 1 10*3/uL (ref 1.0–3.6)
MCH: 31 pg (ref 26.0–34.0)
MCHC: 35.7 g/dL (ref 32.0–36.0)
MCV: 86.7 fL (ref 80.0–100.0)
Monocytes Absolute: 0.4 10*3/uL (ref 0.2–1.0)
Monocytes Relative: 7 %
Neutro Abs: 3.4 10*3/uL (ref 1.4–6.5)
Neutrophils Relative %: 68 %
Platelets: 115 10*3/uL — ABNORMAL LOW (ref 150–440)
RBC: 5.38 MIL/uL (ref 4.40–5.90)
RDW: 13 % (ref 11.5–14.5)
WBC: 5 10*3/uL (ref 3.8–10.6)

## 2015-10-26 LAB — COMPREHENSIVE METABOLIC PANEL
ALT: 22 U/L (ref 17–63)
AST: 18 U/L (ref 15–41)
Albumin: 4.4 g/dL (ref 3.5–5.0)
Alkaline Phosphatase: 69 U/L (ref 38–126)
Anion gap: 5 (ref 5–15)
BUN: 19 mg/dL (ref 6–20)
CO2: 25 mmol/L (ref 22–32)
Calcium: 9.3 mg/dL (ref 8.9–10.3)
Chloride: 106 mmol/L (ref 101–111)
Creatinine, Ser: 1.32 mg/dL — ABNORMAL HIGH (ref 0.61–1.24)
GFR calc Af Amer: >60 mL/min (ref >60)
GFR calc non Af Amer: >60 mL/min (ref >60)
Glucose, Bld: 126 mg/dL — ABNORMAL HIGH (ref 65–99)
Potassium: 3.9 mmol/L (ref 3.5–5.1)
Sodium: 136 mmol/L (ref 135–145)
Total Bilirubin: 1.1 mg/dL (ref 0.3–1.2)
Total Protein: 7.6 g/dL (ref 6.5–8.1)

## 2015-10-26 NOTE — Progress Notes (Signed)
[facility]  Clinic day:  10/26/2015  Chief Complaint: Kenneth Alma Rossel Montez Hageman. is an 40 y.o. male with hairy cell leukemia s/p cladribine 1 year ago who is seen for 3 month assessment.  HPI: The patient was last seen in the medical oncology clinic on 07/14/2015. At that time, he was seen 9 months post cladribine.  He was seen for a sick call visit.  He noted a pruritic rash on his shoulders and lower back that started on 07/12/2015.  He had spots on his legs.  He denied any new medications, soaps, detergents or colognes.  He had fish and ate out at [facility].  We discussed symptom management for a possible food allergy.  CBC revealed a hematocrit of 45.9, hemoglobin 16, platelets 117,000, WBC 5800 with an ANC of 4000.  During the interim, he had done well.  He had a sinus infection 1 month ago.  He has continued his valacyclovir.  Past Medical History  Diagnosis Date  . Hairy cell leukemia (HCC)   . Hairy cell leukemia (HCC) 02/09/2015  . Allergy     seasonal    Past Surgical History  Procedure Laterality Date  . Rotator cuff repair Right 2009, 2010  . Hernia repair  2011  . Inguinal hernia repair    . Cholecystectomy  12/15/14    Family History  Problem Relation Age of Onset  . Hypertension Mother   . Hypertension Father   . Cancer Paternal Aunt   . Cancer Paternal Grandmother   . Cancer Paternal Grandfather     Social History:  reports that he has never smoked. He has never used smokeless tobacco. He reports that he drinks alcohol. He reports that he does not use illicit drugs.   His daughter was playing basketball at school and suffered a concussion.  He is working full time as Gaffer.  He works out at [employer] regularly.  The patient is alone today.  Allergies:  Allergies  Allergen Reactions  . Diclofenac Other (See Comments)    GI DISTRESS  . Fentanyl Other (See Comments)    Low tolerance   Current  Medications: Current Outpatient Prescriptions  Medication Sig Dispense Refill  . calcium carbonate (TUMS - DOSED IN MG ELEMENTAL CALCIUM) 500 MG chewable tablet Chew 1 tablet by mouth 2 (two) times daily as needed.     . Creatine 5000 MG PACK Take 5,000 mg by mouth.    Marland Kitchen ibuprofen (ADVIL,MOTRIN) 200 MG tablet Take 200 mg by mouth every 6 (six) hours as needed.    . Multiple Vitamin (MULTIVITAMIN) tablet Take 1 tablet by mouth daily.    . valACYclovir (VALTREX) 500 MG tablet Take 1 tablet (500 mg total) by mouth daily. 30 tablet 1   No current facility-administered medications for this visit.   Review of Systems:  GENERAL:  Feels good.  Active.  No fevers, sweats or weight loss. PERFORMANCE STATUS (ECOG):  1 HEENT:  Interval sinus infection.  No visual changes, runny nose, sore throat, mouth sores or tenderness. Lungs: No shortness of breath or cough.  No hemoptysis. Cardiac:  No chest pain, palpitations, orthopnea, or PND. GI:  No nausea, vomiting, diarrhea, constipation, melena or hematochezia. GU:  No urgency, frequency, dysuria, or hematuria. Musculoskeletal:  No back pain.  No joint pain.  No muscle tenderness. Extremities:  No pain or swelling. Skin:  No rashes, ulcers, or skin changes.  Neuro:  No headache, numbness or  weakness, balance or coordination issues. Endocrine:  No diabetes, thyroid issues, hot flashes or night sweats. Psych:  No mood changes, depression or anxiety. Pain:  No focal pain. Review of systems:  All other systems reviewed and found to be negative.   Physical Exam: Blood pressure 130/75, pulse 70, temperature 96.8 F (36 C), temperature source Tympanic, resp. rate 18, weight 246 pounds 5.8 oz (111.75 kg) GENERAL:  Well developed, well nourished, muscular gentleman sitting comfortably in the exam room in no acute distress. MENTAL STATUS:  Alert and oriented to person, place and time. HEAD:  Alopecia.  Short beard.  Normocephalic, atraumatic, face symmetric,  no Cushingoid features. EYES:  Pupils equal round and reactive to light and accomodation.  No conjunctivitis or scleral icterus. ENT:  Oropharynx clear without lesion.  Tongue normal. Mucous membranes moist.  RESPIRATORY:  Clear to auscultation without rales, wheezes or rhonchi. CARDIOVASCULAR:  Regular rate and rhythm without murmur, rub or gallop. ABDOMEN:  Soft, non-tender, with active bowel sounds, and no hepatosplenomegaly. SKIN:  No rashes, ulcers, or skin changes.  EXTREMITIES: No edema, no skin discoloration or tenderness.  No palpable cords. LYMPH NODES: No palpable cervical, supraclavicular, axillary or inguinal adenopathy  NEUROLOGICAL: Unremarkable. PSYCH:  Appropriate.  Appointment on 10/26/2015  Component Date Value Ref Range Status  . WBC 10/26/2015 5.0  3.8 - 10.6 K/uL Final  . RBC 10/26/2015 5.38  4.40 - 5.90 MIL/uL Final  . Hemoglobin 10/26/2015 16.6  13.0 - 18.0 g/dL Final   RESULT REPEATED AND VERIFIED  . HCT 10/26/2015 46.6  40.0 - 52.0 % Final   RESULT REPEATED AND VERIFIED  . MCV 10/26/2015 86.7  80.0 - 100.0 fL Final  . MCH 10/26/2015 31.0  26.0 - 34.0 pg Final  . MCHC 10/26/2015 35.7  32.0 - 36.0 g/dL Final  . RDW 10/26/2015 13.0  11.5 - 14.5 % Final  . Platelets 10/26/2015 115* 150 - 440 K/uL Final  . Neutrophils Relative % 10/26/2015 68%   Final  . Neutro Abs 10/26/2015 3.4  1.4 - 6.5 K/uL Final  . Lymphocytes Relative 10/26/2015 20%   Final  . Lymphs Abs 10/26/2015 1.0  1.0 - 3.6 K/uL Final  . Monocytes Relative 10/26/2015 7%   Final  . Monocytes Absolute 10/26/2015 0.4  0.2 - 1.0 K/uL Final  . Eosinophils Relative 10/26/2015 4%   Final  . Eosinophils Absolute 10/26/2015 0.2  0 - 0.7 K/uL Final  . Basophils Relative 10/26/2015 1%   Final  . Basophils Absolute 10/26/2015 0.0  0 - 0.1 K/uL Final  . Sodium 10/26/2015 136  135 - 145 mmol/L Final  . Potassium 10/26/2015 3.9  3.5 - 5.1 mmol/L Final  . Chloride 10/26/2015 106  101 - 111 mmol/L Final  . CO2  10/26/2015 25  22 - 32 mmol/L Final  . Glucose, Bld 10/26/2015 126* 65 - 99 mg/dL Final  . BUN 10/26/2015 19  6 - 20 mg/dL Final  . Creatinine, Ser 10/26/2015 1.32* 0.61 - 1.24 mg/dL Final  . Calcium 10/26/2015 9.3  8.9 - 10.3 mg/dL Final  . Total Protein 10/26/2015 7.6  6.5 - 8.1 g/dL Final  . Albumin 10/26/2015 4.4  3.5 - 5.0 g/dL Final  . AST 10/26/2015 18  15 - 41 U/L Final  . ALT 10/26/2015 22  17 - 63 U/L Final  . Alkaline Phosphatase 10/26/2015 69  38 - 126 U/L Final  . Total Bilirubin 10/26/2015 1.1  0.3 - 1.2 mg/dL Final  . GFR  calc non Af Amer 10/26/2015 >60  >60 mL/min Final  . GFR calc Af Amer 10/26/2015 >60  >60 mL/min Final   Comment: (NOTE) The eGFR has been calculated using the CKD EPI equation. This calculation has not been validated in all clinical situations. eGFR's persistently <60 mL/min signify possible Chronic Kidney Disease.   Georgiann Hahn gap 10/26/2015 5  5 - 15 Final   Assessment:  Kenneth Labs Porco [name] is an 40 y.o. male gentleman with hairy cell leukemia.  He presented with symptomatic splenomegaly and mild leukopenia and thrombocytopenia.  He is currently day 1 year s/p cladribine (09/30/2014 - 10/07/2014).  He is on prophylactic valacyclovir.  Counts are normal except for a slightly low platelet count.    He has a history of severe abdominal pain. Abdominal and pelvic CT scan on 11/30/2014 revealed chronic cholelithiasis. He underwent cholecystectomy on 12/15/2014.  He had a sinus infection 1 month ago.  Symptomatically, he feels well.  Exam is unremarkable.  Plan: 1.  Labs today:  CBC with diff, CMP. 2.  Discontinue valacyclovir. 3.  RTC in 4 months for MD assessment and labs (CBC with diff, CMP).   Lequita Asal, MD  10/26/2015, 10:28 AM

## 2015-10-28 ENCOUNTER — Encounter: Payer: Self-pay | Admitting: Hematology and Oncology

## 2015-11-01 IMAGING — CR DG CHEST 1V
1 series · 1 of 1 positions shown · non-contrast
Comparison: 09/30/2014

CLINICAL DATA: Fever, history of leukemia, neutropenia

EXAM:
CHEST - 1 VIEW

[ap]
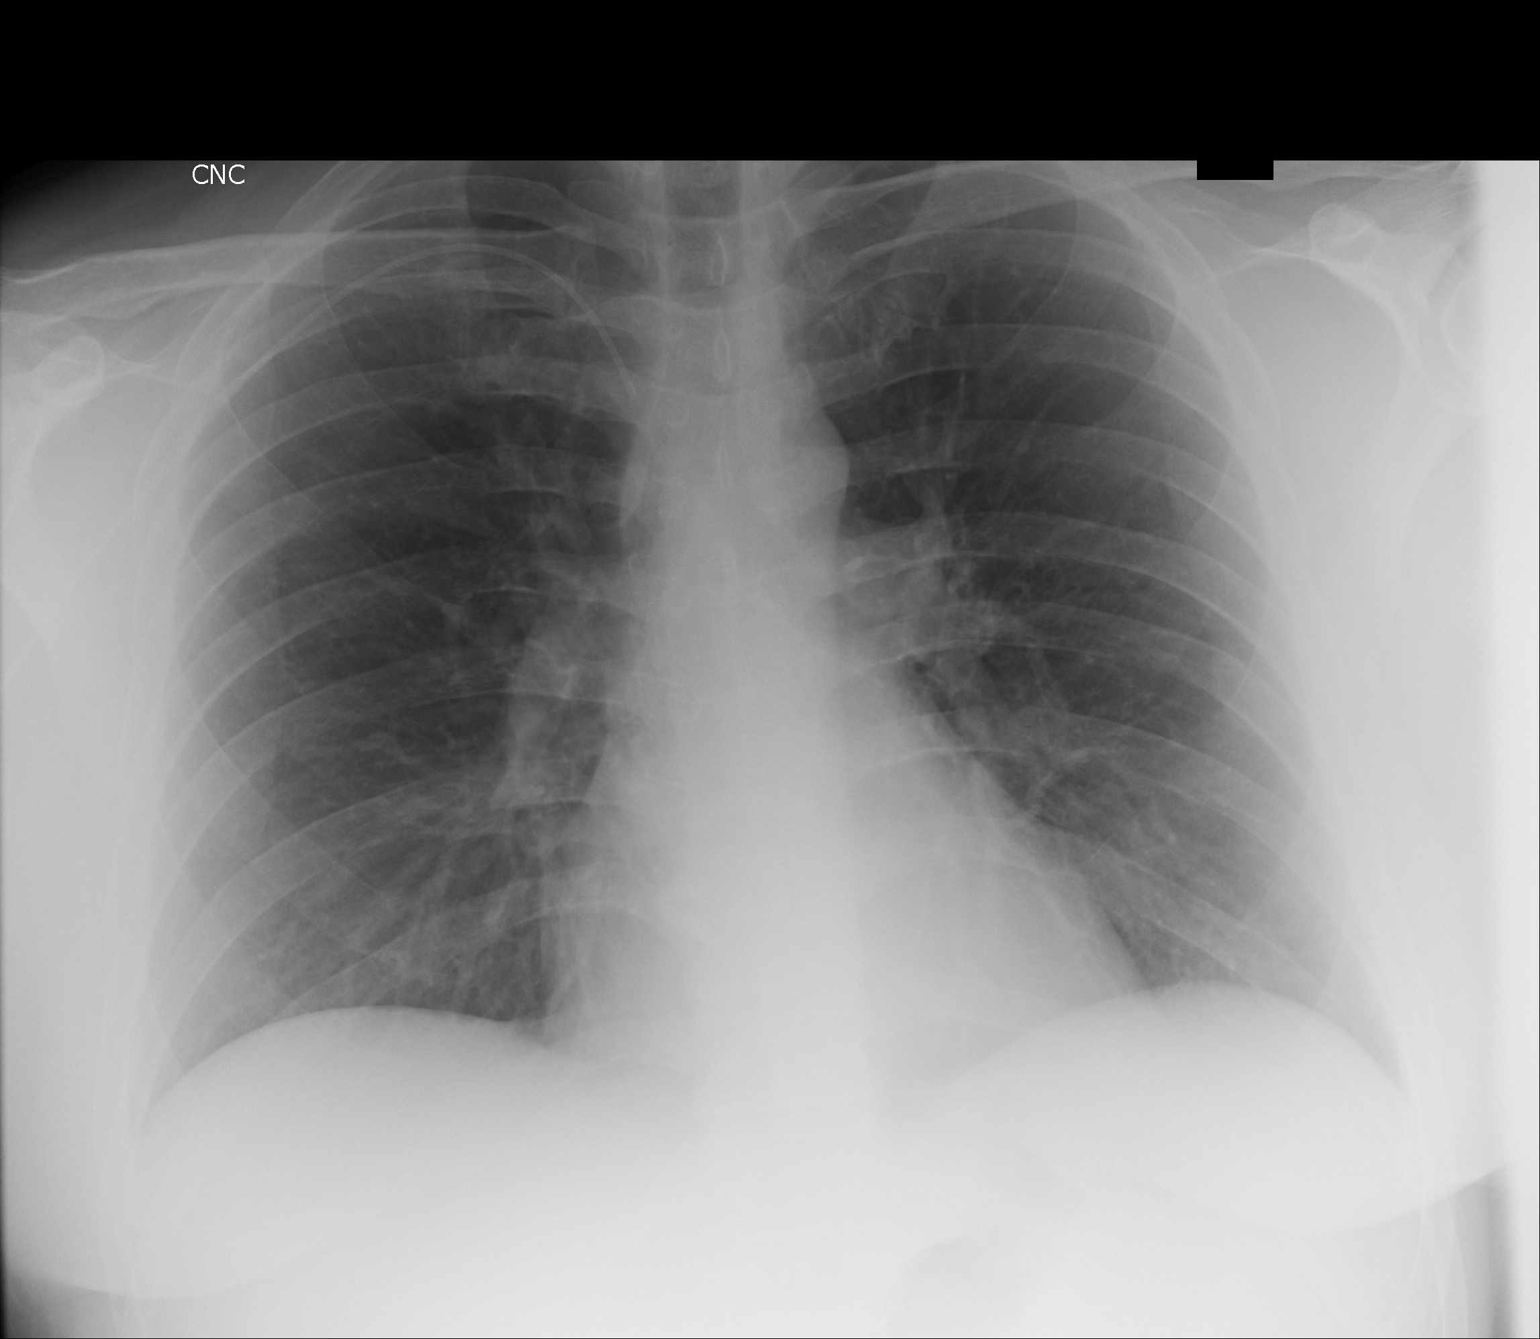

[1 of 1 positions shown; findings below may reference images not displayed]

FINDINGS: Cardiomediastinal silhouette is stable. No acute infiltrate or
pleural effusion. No pulmonary edema. There is right arm PICC line
with tip in upper SVC.
IMPRESSION: No active disease.  Right arm PICC line with tip in upper SVC.

## 2015-12-23 IMAGING — CT CT ABD-PELV W/ CM
2 of 4 series · 16 of 46 positions shown, 18 images · IV contrast (omnipaque)
Comparison: CT Abdomen and Pelvis 10/18/2009.

CLINICAL DATA: 38-year-old male with sternal chest and epigastric
pain for 4 days. Current history of leukemia. Initial encounter.

EXAM:
CT ABDOMEN AND PELVIS WITH CONTRAST
TECHNIQUE: Multidetector CT imaging of the abdomen and pelvis was performed
using the standard protocol following bolus administration of
intravenous contrast.
CONTRAST:  100 mL Omnipaque 300.

[Series 2: routine abd pel with · axial · 0.76mm/px · z∈[-544,-104]mm · 13 of 98 slices shown, 15 images]
[im 5/98  soft-tissue]
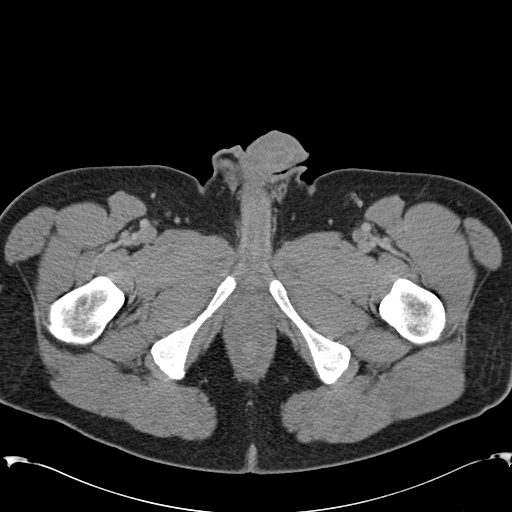
[im 5/98  bone]
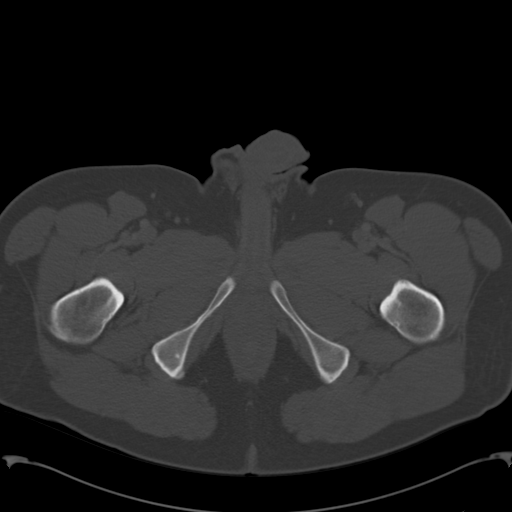
[im 13/98  soft-tissue]
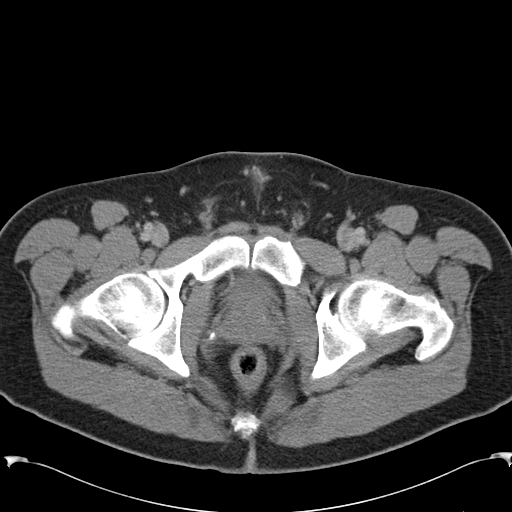
[im 22/98  soft-tissue]
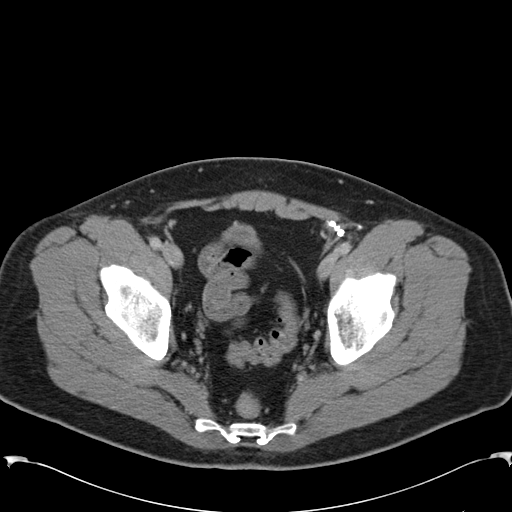
[im 26/98  soft-tissue]
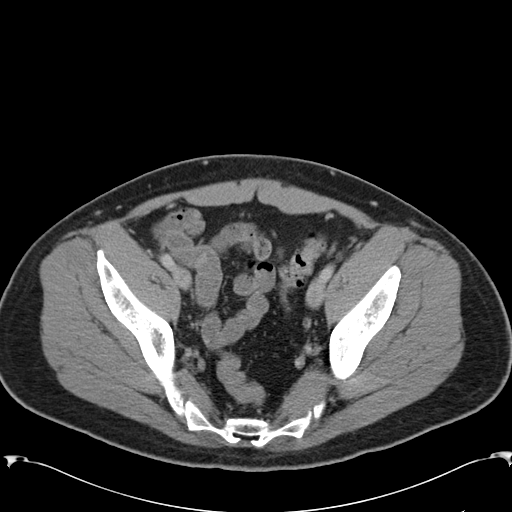
[im 34/98  soft-tissue]
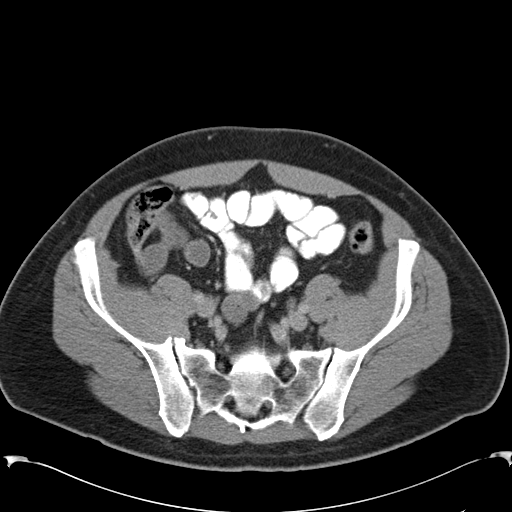
[im 43/98  soft-tissue]
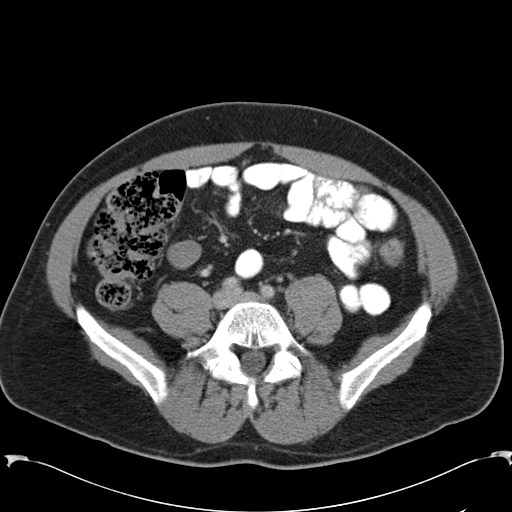
[im 51/98  soft-tissue]
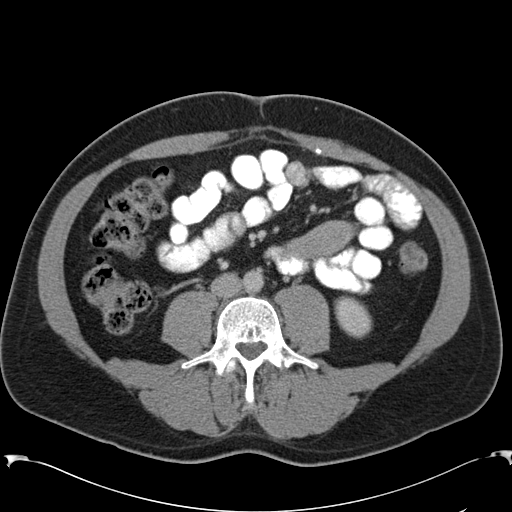
[im 55/98  soft-tissue]
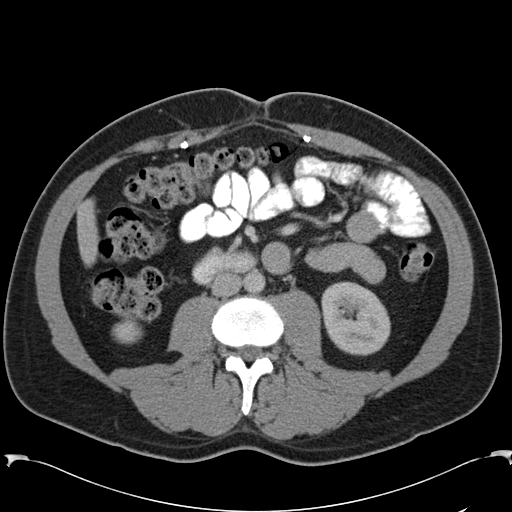
[im 64/98  soft-tissue]
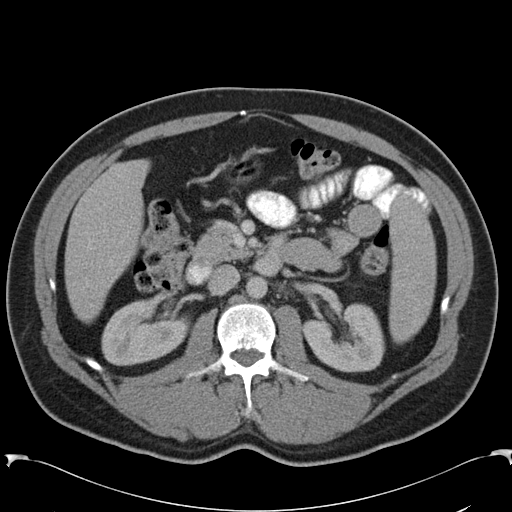
[im 64/98  bone]
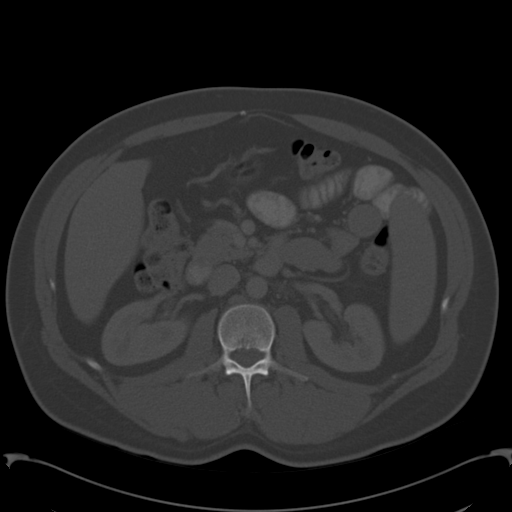
[im 72/98  soft-tissue]
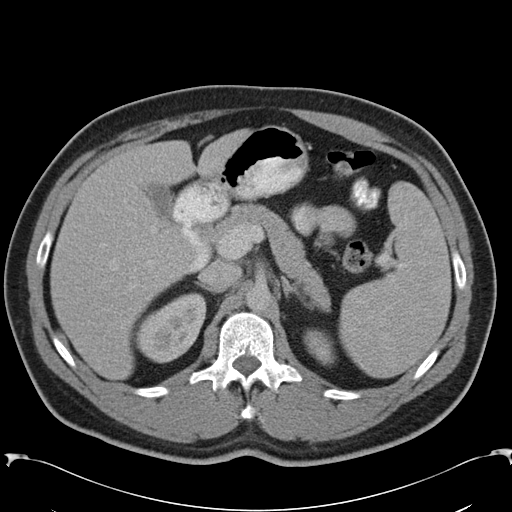
[im 76/98  soft-tissue]
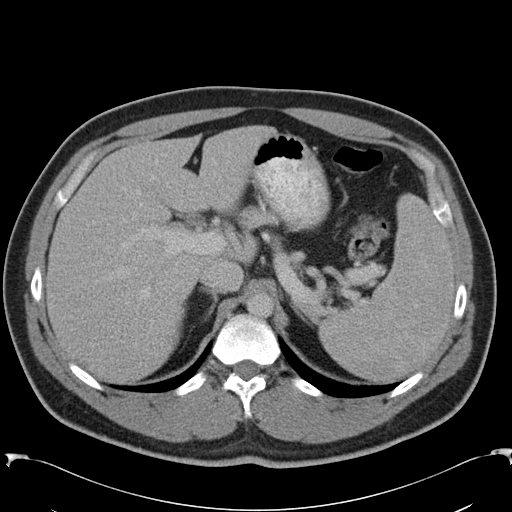
[im 85/98  soft-tissue]
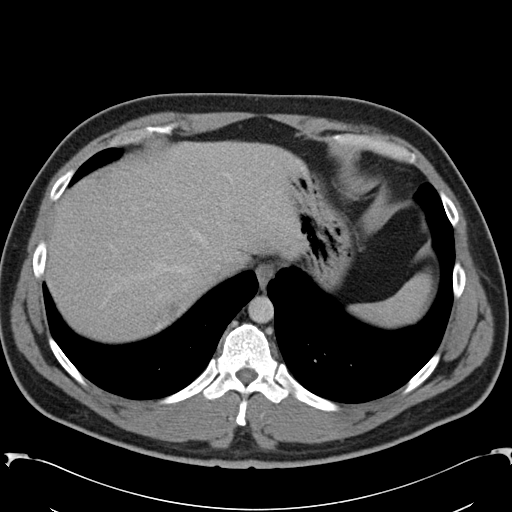
[im 93/98  soft-tissue]
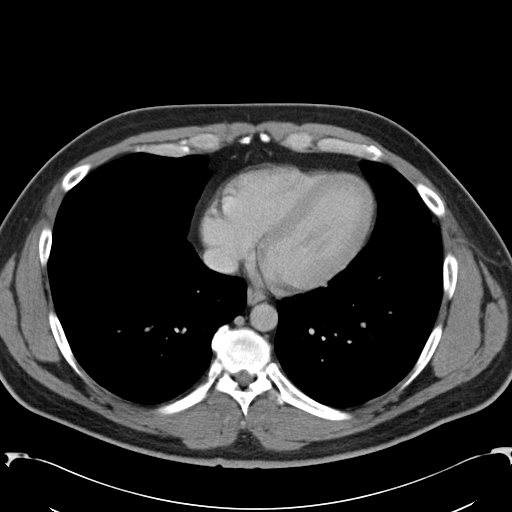

[Series 5: cor routine abd pel with · coronal · 0.72mm/px · 3 of 146 slices shown]
[im 49/146  soft-tissue]
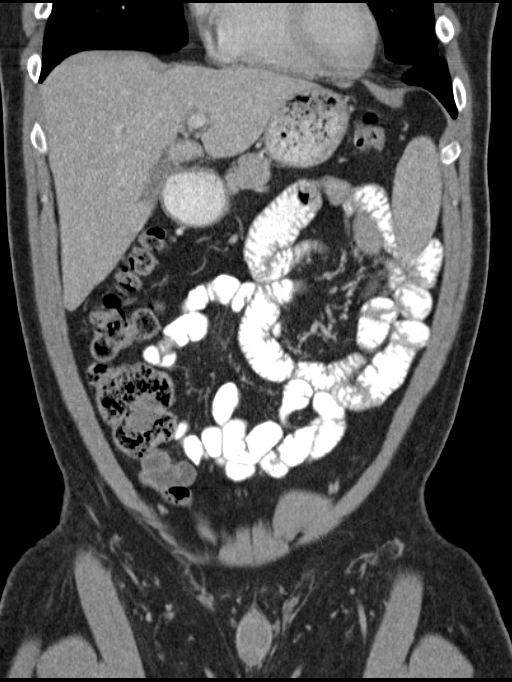
[im 65/146  soft-tissue]
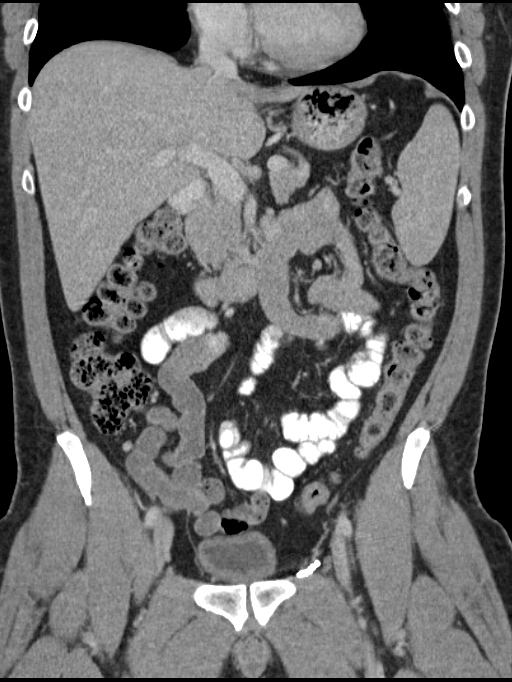
[im 81/146  soft-tissue]
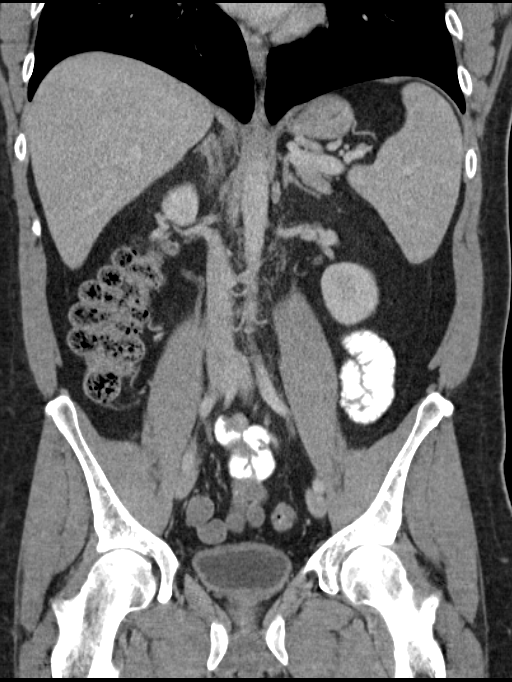

[16 of 46 positions shown; findings below may reference images not displayed]

FINDINGS: Negative lung bases.  No pericardial or pleural effusion.

Unremarkable visualized xiphoid process. No acute or suspicious
osseous lesion identified. Sequelae of left inguinal hernia repair
re- identified. Sequelae of ventral abdominal hernia repair new
since 8266.

No pelvic free fluid. Diminutive bladder. Decompressed distal colon.
Negative left colon, transverse colon, right colon, and appendix.
Negative terminal ileum. Oral contrast has not yet reached the
distal small bowel. No dilated small bowel. Negative stomach and
duodenum.

Chronic cholelithiasis. No pericholecystic inflammation identified.
Chronic liver dome hemangioma appears stable since 8266 (series 2,
image 16). Liver enhancement elsewhere within normal limits.

The spleen appears mildly increased in size since 8266, but
homogeneously enhancing. No discrete splenic lesion.

Pancreas, adrenal glands, portal venous system, and major arterial
structures are within normal limits. Kidneys appear stable and
within normal limits. No abdominal free fluid. No lymphadenopathy.
IMPRESSION: 1. No acute or metastatic process identified in the abdomen or
pelvis.
2. Ventral abdominal hernia repair new since [DATE]. Chronic left inguinal hernia repair. Chronic cholelithiasis.
Chronic liver hemangioma.

## 2016-01-27 ENCOUNTER — Telehealth: Payer: Self-pay | Admitting: *Deleted

## 2016-01-27 DIAGNOSIS — C9140 Hairy cell leukemia not having achieved remission: Secondary | ICD-10-CM

## 2016-01-27 NOTE — Telephone Encounter (Signed)
Per Dr Mike Gip, check CBCD , wife accepted appt 5/5 @ 8 am for pt

## 2016-01-27 NOTE — Telephone Encounter (Signed)
Told his wife he is not feeling well and having side discomfort again like when he was first diagnosed. He does not have an appt until 6/6. Do you want to check labs on him before then?

## 2016-01-28 ENCOUNTER — Inpatient Hospital Stay: Payer: 59 | Attending: Hematology and Oncology

## 2016-01-28 ENCOUNTER — Telehealth: Payer: Self-pay

## 2016-01-28 DIAGNOSIS — Z9221 Personal history of antineoplastic chemotherapy: Secondary | ICD-10-CM | POA: Diagnosis not present

## 2016-01-28 DIAGNOSIS — C9140 Hairy cell leukemia not having achieved remission: Secondary | ICD-10-CM | POA: Insufficient documentation

## 2016-01-28 DIAGNOSIS — Z79899 Other long term (current) drug therapy: Secondary | ICD-10-CM | POA: Diagnosis not present

## 2016-01-28 LAB — CBC WITH DIFFERENTIAL/PLATELET
Basophils Absolute: 0 10*3/uL (ref 0–0.1)
Basophils Relative: 0 %
Eosinophils Absolute: 0.1 10*3/uL (ref 0–0.7)
Eosinophils Relative: 2 %
HCT: 45.6 % (ref 40.0–52.0)
Hemoglobin: 16.4 g/dL (ref 13.0–18.0)
Lymphocytes Relative: 22 %
Lymphs Abs: 1.1 10*3/uL (ref 1.0–3.6)
MCH: 30.8 pg (ref 26.0–34.0)
MCHC: 36 g/dL (ref 32.0–36.0)
MCV: 85.6 fL (ref 80.0–100.0)
Monocytes Absolute: 0.3 10*3/uL (ref 0.2–1.0)
Monocytes Relative: 7 %
Neutro Abs: 3.4 10*3/uL (ref 1.4–6.5)
Neutrophils Relative %: 69 %
Platelets: 111 10*3/uL — ABNORMAL LOW (ref 150–440)
RBC: 5.33 MIL/uL (ref 4.40–5.90)
RDW: 13.1 % (ref 11.5–14.5)
WBC: 5 10*3/uL (ref 3.8–10.6)

## 2016-01-28 NOTE — Telephone Encounter (Signed)
Returned pt's phone call and addressed concerns.  Pt complaining that  when he eats he feels pain at the spleen area,.  Pt stated it is like it was when he was first diagnosed however reports that his choice in foods lately have not been so healthy or great.  I verbalized to pt that we can see him if he wants to address it and be seen.  Labs drawn today per MD pt is stable and MD has no concerns at this time.  Pt verbalized that he has an appointment coming soon and will try to wait till then.  I verbalized again to please call with any concerns or move appt up.  Pt verbalized an understanding.  No other concerns noted.

## 2016-02-29 ENCOUNTER — Inpatient Hospital Stay (HOSPITAL_BASED_OUTPATIENT_CLINIC_OR_DEPARTMENT_OTHER): Payer: 59 | Admitting: Hematology and Oncology

## 2016-02-29 ENCOUNTER — Encounter: Payer: Self-pay | Admitting: Hematology and Oncology

## 2016-02-29 ENCOUNTER — Inpatient Hospital Stay: Payer: 59 | Attending: Hematology and Oncology

## 2016-02-29 VITALS — BP 139/85 | HR 79 | Temp 96.0°F | Ht 73.0 in | Wt 247.9 lb

## 2016-02-29 DIAGNOSIS — Z79899 Other long term (current) drug therapy: Secondary | ICD-10-CM | POA: Diagnosis not present

## 2016-02-29 DIAGNOSIS — C9140 Hairy cell leukemia not having achieved remission: Secondary | ICD-10-CM | POA: Insufficient documentation

## 2016-02-29 DIAGNOSIS — L0592 Pilonidal sinus without abscess: Secondary | ICD-10-CM | POA: Insufficient documentation

## 2016-02-29 DIAGNOSIS — K311 Adult hypertrophic pyloric stenosis: Secondary | ICD-10-CM | POA: Insufficient documentation

## 2016-02-29 DIAGNOSIS — Z9221 Personal history of antineoplastic chemotherapy: Secondary | ICD-10-CM | POA: Diagnosis not present

## 2016-02-29 DIAGNOSIS — R109 Unspecified abdominal pain: Secondary | ICD-10-CM | POA: Insufficient documentation

## 2016-02-29 DIAGNOSIS — K649 Unspecified hemorrhoids: Secondary | ICD-10-CM | POA: Insufficient documentation

## 2016-02-29 LAB — CBC WITH DIFFERENTIAL/PLATELET
Basophils Absolute: 0 10*3/uL (ref 0–0.1)
Basophils Relative: 1 %
Eosinophils Absolute: 0.2 10*3/uL (ref 0–0.7)
Eosinophils Relative: 4 %
HCT: 47.1 % (ref 40.0–52.0)
Hemoglobin: 16.8 g/dL (ref 13.0–18.0)
Lymphocytes Relative: 27 %
Lymphs Abs: 1.7 10*3/uL (ref 1.0–3.6)
MCH: 30.5 pg (ref 26.0–34.0)
MCHC: 35.6 g/dL (ref 32.0–36.0)
MCV: 85.5 fL (ref 80.0–100.0)
Monocytes Absolute: 0.4 10*3/uL (ref 0.2–1.0)
Monocytes Relative: 6 %
Neutro Abs: 3.8 10*3/uL (ref 1.4–6.5)
Neutrophils Relative %: 62 %
Platelets: 135 10*3/uL — ABNORMAL LOW (ref 150–440)
RBC: 5.51 MIL/uL (ref 4.40–5.90)
RDW: 13.2 % (ref 11.5–14.5)
WBC: 6.2 10*3/uL (ref 3.8–10.6)

## 2016-02-29 LAB — COMPREHENSIVE METABOLIC PANEL
ALT: 22 U/L (ref 17–63)
AST: 20 U/L (ref 15–41)
Albumin: 4.6 g/dL (ref 3.5–5.0)
Alkaline Phosphatase: 68 U/L (ref 38–126)
Anion gap: 6 (ref 5–15)
BUN: 23 mg/dL — ABNORMAL HIGH (ref 6–20)
CO2: 24 mmol/L (ref 22–32)
Calcium: 9.5 mg/dL (ref 8.9–10.3)
Chloride: 108 mmol/L (ref 101–111)
Creatinine, Ser: 1.23 mg/dL (ref 0.61–1.24)
GFR calc Af Amer: >60 mL/min (ref >60)
GFR calc non Af Amer: >60 mL/min (ref >60)
Glucose, Bld: 102 mg/dL — ABNORMAL HIGH (ref 65–99)
Potassium: 3.8 mmol/L (ref 3.5–5.1)
Sodium: 138 mmol/L (ref 135–145)
Total Bilirubin: 1.3 mg/dL — ABNORMAL HIGH (ref 0.3–1.2)
Total Protein: 7.8 g/dL (ref 6.5–8.1)

## 2016-02-29 NOTE — Progress Notes (Signed)
Patient here for follow up. No complaints today. 

## 2016-02-29 NOTE — Progress Notes (Signed)
[facility] day:  02/29/2016  Chief Complaint: Kenneth Labs Vangorder [name] 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  HPI: The patient was last seen in the medical oncology clinic on 10/26/2015. At that time, he was doing well.  He had a sinus infection 1 month prior.  Exam was stable.  Labs revealed a hematocrit of 46.6, hemoglobin 16.6, platelets 115,000, WBC 5000 with an ANC of 3400.  He contacted the clinic on 01/27/2016 as he was not feeling well and had discomfort in his left side which he associated with his disease.  CBC on 01/28/2016 revealed a hematocrit of 45.6, hemoglobin 16.4, platelets 111,000, WBC 5000 with an ANC of 3400.  During the interim, he notes that his stomach still bothers him on the left side.  He describes this as a discomfort.  He is also concerned about a lump on his neck (possibly ingrown hair).  He denies any fever or sweats.  He takes creatine and works out at [employer].   Past Medical History  Diagnosis Date  . Hairy cell leukemia ([place])   . Hairy cell leukemia ([place]) 02/09/2015  . Allergy     seasonal    Past Surgical History  Procedure Laterality Date  . Rotator cuff repair Right 2009, 2010  . Hernia repair  2011  . Inguinal hernia repair    . Cholecystectomy  12/15/14    Family History  Problem Relation Age of Onset  . Hypertension Mother   . Hypertension Father   . Cancer Paternal Aunt   . Cancer Paternal Grandmother   . Cancer Paternal Grandfather     Social History:  reports that he has never smoked. He has never used smokeless tobacco. He reports that he drinks alcohol. He reports that he does not use illicit drugs.   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  The patient is alone today.  Allergies:  Allergies  Allergen Reactions  . Diclofenac Other (See Comments)    GI DISTRESS  .  Fentanyl Other (See Comments)    Low tolerance   Current Medications: Current Outpatient Prescriptions  Medication Sig Dispense Refill  . Creatine 5000 MG PACK Take 5,000 mg by mouth.    Marland Kitchen ibuprofen (ADVIL,MOTRIN) 200 MG tablet Take 200 mg by mouth every 6 (six) hours as needed.    . Multiple Vitamin (MULTIVITAMIN) tablet Take 1 tablet by mouth daily.    . Omega-3 Fatty Acids (FISH OIL) 500 MG CAPS Take 500 capsules by mouth.     No current facility-administered medications for this visit.   Review of Systems:  GENERAL:  Feels good.  Active.  No fevers or sweats.  Weight up 1 pound. PERFORMANCE STATUS (ECOG):  0 HEENT:  No visual changes, runny nose, sore throat, mouth sores or tenderness. Lungs: No shortness of breath or cough.  No hemoptysis. Cardiac:  No chest pain, palpitations, orthopnea, or PND. GI:  Left upper quadrant discomfort at times.  No nausea, vomiting, diarrhea, constipation, melena or hematochezia. GU:  No urgency, frequency, dysuria, or hematuria. Musculoskeletal:  No back pain.  No joint pain.  No muscle tenderness. Extremities:  No pain or swelling. Skin:  No rashes, ulcers, or skin changes.  Neuro:  No headache, numbness or weakness, balance or coordination issues. Endocrine:  No diabetes, thyroid  issues, hot flashes or night sweats. Psych:  No mood changes, depression or anxiety. Pain:  No focal pain. Review of systems:  All other systems reviewed and found to be negative.   Physical Exam: Blood pressure 139/85, pulse 79, temperature 96 F (35.6 C), temperature source Tympanic, height '6\' 1"'$  (1.854 m), weight 247 lb 14.5 oz (112.45 kg).  Blood pressure 130/75, pulse 70, temperature 96.8 F (36 C), temperature source Tympanic, resp. rate 18, weight 246 pounds 5.8 oz (111.75 kg) GENERAL:  Well developed, well nourished, muscular gentleman sitting comfortably in the exam room in no acute distress. MENTAL STATUS:  Alert and oriented to person, place and  time. HEAD:  Alopecia.  Short beard.  Normocephalic, atraumatic, face symmetric, no Cushingoid features. EYES:  Pupils equal round and reactive to light and accomodation.  No conjunctivitis or scleral icterus. ENT:  Oropharynx clear without lesion.  Tongue normal. Mucous membranes moist.  RESPIRATORY:  Clear to auscultation without rales, wheezes or rhonchi. CARDIOVASCULAR:  Regular rate and rhythm without murmur, rub or gallop. ABDOMEN:  Soft, non-tender, with active bowel sounds, and no hepatosplenomegaly. SKIN:  Small SQ lesion under left chin in beard (? ingrown hair).  No rashes or ulcers.  EXTREMITIES: No edema, no skin discoloration or tenderness.  No palpable cords. LYMPH NODES: No palpable cervical, supraclavicular, axillary or inguinal adenopathy  NEUROLOGICAL: Unremarkable. PSYCH:  Appropriate.  Appointment on 02/29/2016  Component Date Value Ref Range Status  . WBC 02/29/2016 6.2  3.8 - 10.6 K/uL Final  . RBC 02/29/2016 5.51  4.40 - 5.90 MIL/uL Final  . Hemoglobin 02/29/2016 16.8  13.0 - 18.0 g/dL Final   RESULT REPEATED AND VERIFIED  . HCT 02/29/2016 47.1  40.0 - 52.0 % Final   RESULT REPEATED AND VERIFIED  . MCV 02/29/2016 85.5  80.0 - 100.0 fL Final  . MCH 02/29/2016 30.5  26.0 - 34.0 pg Final  . MCHC 02/29/2016 35.6  32.0 - 36.0 g/dL Final  . RDW 02/29/2016 13.2  11.5 - 14.5 % Final  . Platelets 02/29/2016 135* 150 - 440 K/uL Final  . Neutrophils Relative % 02/29/2016 62%   Final  . Neutro Abs 02/29/2016 3.8  1.4 - 6.5 K/uL Final  . Lymphocytes Relative 02/29/2016 27%   Final  . Lymphs Abs 02/29/2016 1.7  1.0 - 3.6 K/uL Final  . Monocytes Relative 02/29/2016 6%   Final  . Monocytes Absolute 02/29/2016 0.4  0.2 - 1.0 K/uL Final  . Eosinophils Relative 02/29/2016 4%   Final  . Eosinophils Absolute 02/29/2016 0.2  0 - 0.7 K/uL Final  . Basophils Relative 02/29/2016 1%   Final  . Basophils Absolute 02/29/2016 0.0  0 - 0.1 K/uL Final  . Sodium 02/29/2016 138  135 - 145  mmol/L Final  . Potassium 02/29/2016 3.8  3.5 - 5.1 mmol/L Final  . Chloride 02/29/2016 108  101 - 111 mmol/L Final  . CO2 02/29/2016 24  22 - 32 mmol/L Final  . Glucose, Bld 02/29/2016 102* 65 - 99 mg/dL Final  . BUN 02/29/2016 23* 6 - 20 mg/dL Final  . Creatinine, Ser 02/29/2016 1.23  0.61 - 1.24 mg/dL Final  . Calcium 02/29/2016 9.5  8.9 - 10.3 mg/dL Final  . Total Protein 02/29/2016 7.8  6.5 - 8.1 g/dL Final  . Albumin 02/29/2016 4.6  3.5 - 5.0 g/dL Final  . AST 02/29/2016 20  15 - 41 U/L Final  . ALT 02/29/2016 22  17 - 63 U/L Final  .  Alkaline Phosphatase 02/29/2016 68  38 - 126 U/L Final  . Total Bilirubin 02/29/2016 1.3* 0.3 - 1.2 mg/dL Final  . GFR calc non Af Amer 02/29/2016 >60  >60 mL/min Final  . GFR calc Af Amer 02/29/2016 >60  >60 mL/min Final   Comment: (NOTE) The eGFR has been calculated using the CKD EPI equation. This calculation has not been validated in all clinical situations. eGFR's persistently <60 mL/min signify possible Chronic Kidney Disease.   Georgiann Hahn gap 02/29/2016 6  5 - 15 Final   Assessment:  Kenneth Labs Steinhauser [name] is an 40 y.o. male gentleman with hairy cell leukemia.  He presented with symptomatic splenomegaly and mild leukopenia and thrombocytopenia.  He is 17 months s/p cladribine (09/30/2014 - 10/07/2014).  Counts are normal except for a slightly low platelet count.    He has a history of severe abdominal pain. Abdominal and pelvic CT scan on 11/30/2014 revealed chronic cholelithiasis. He underwent cholecystectomy on 12/15/2014.  He had a sinus infection 5 months ago.  Symptomatically, he feels has intermittent left sided abdominal discomfort.  Exam is unremarkable.  Platelet count has improved (135,000).  Plan: 1.  Labs today:  CBC with diff, CMP. 2.  Reassurance. 3.  RTC in 4 months for MD assessment and labs (CBC with diff, CMP).   Lequita Asal, MD  02/29/2016, 11:21 AM

## 2016-04-17 ENCOUNTER — Emergency Department
Admission: EM | Admit: 2016-04-17 | Discharge: 2016-04-17 | Disposition: A | Discharge status: Home / Self Care | Payer: 59 | Attending: Emergency Medicine | Admitting: Emergency Medicine

## 2016-04-17 ENCOUNTER — Emergency Department: Payer: 59

## 2016-04-17 ENCOUNTER — Encounter: Payer: Self-pay | Admitting: *Deleted

## 2016-04-17 DIAGNOSIS — Z791 Long term (current) use of non-steroidal anti-inflammatories (NSAID): Secondary | ICD-10-CM | POA: Diagnosis not present

## 2016-04-17 DIAGNOSIS — R0789 Other chest pain: Secondary | ICD-10-CM | POA: Diagnosis present

## 2016-04-17 DIAGNOSIS — R079 Chest pain, unspecified: Secondary | ICD-10-CM

## 2016-04-17 LAB — CBC
HCT: 44.2 % (ref 40.0–52.0)
Hemoglobin: 15.8 g/dL (ref 13.0–18.0)
MCH: 30.6 pg (ref 26.0–34.0)
MCHC: 35.7 g/dL (ref 32.0–36.0)
MCV: 85.8 fL (ref 80.0–100.0)
Platelets: 117 K/uL — ABNORMAL LOW (ref 150–440)
RBC: 5.16 MIL/uL (ref 4.40–5.90)
RDW: 13.2 % (ref 11.5–14.5)
WBC: 6.1 K/uL (ref 3.8–10.6)

## 2016-04-17 LAB — BASIC METABOLIC PANEL WITH GFR
Anion gap: 6 (ref 5–15)
BUN: 24 mg/dL — ABNORMAL HIGH (ref 6–20)
CO2: 24 mmol/L (ref 22–32)
Calcium: 9.4 mg/dL (ref 8.9–10.3)
Chloride: 108 mmol/L (ref 101–111)
Creatinine, Ser: 1.1 mg/dL (ref 0.61–1.24)
GFR calc Af Amer: >60 mL/min
GFR calc non Af Amer: >60 mL/min
Glucose, Bld: 94 mg/dL (ref 65–99)
Potassium: 3.6 mmol/L (ref 3.5–5.1)
Sodium: 138 mmol/L (ref 135–145)

## 2016-04-17 LAB — TROPONIN I
Troponin I: <0.03 ng/mL
Troponin I: <0.03 ng/mL

## 2016-04-17 NOTE — ED Provider Notes (Signed)
[facility] Emergency Department Provider Note  Time seen: 5:00 AM  I have reviewed the triage vital signs and the nursing notes.   HISTORY  Chief Complaint Chest Pain    HPI Kenneth Friedman. is a 40 y.o. male with a past medical history of hairy cell leukemia who presents the emergency department with chest pain. According to the patient he was at work and approximately 1:45 AM he developed chest pain with some radiation to his shoulders and neck. Patient states he was not doing anything strenuous when the pain started. States the pain worsened and concerned him so he called EMS. EMS evaluated the patient, and offered to transport him to the hospital however the patient states at that time he began feeling better and he declined transport. He states the chest pain was improving, but has not gone away so he came to the emergency department for evaluation. Denies any nausea, shortness of breath or diaphoresis at any time. Describes the chest discomfort is mild, dull pain currently. Denies any leg pain or swelling. Denies any fever or cough or congestion.     Past Medical History:  Diagnosis Date  . Allergy    seasonal  . Hairy cell leukemia ([place])   . Hairy cell leukemia ([place]) 02/09/2015    Patient Active Problem List   Diagnosis Date Noted  . Hemorrhoid 02/29/2016  . 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 Adult pyloric stenosis 02/29/2016  . Rash 07/14/2015  . Hairy cell leukemia ([place]) 02/09/2015    Past Surgical History:  Procedure Laterality Date  . CHOLECYSTECTOMY  12/15/14  . HERNIA REPAIR  2011  . INGUINAL HERNIA REPAIR    . ROTATOR CUFF REPAIR Right 2009, 2010    Current Outpatient Rx  . Order #: [id number] Class: Historical Med  . Order #: [id number] Class: Historical Med  . Order #: [id number] Class: Historical Med  . Order #: [id number] Class: Historical Med    Allergies Diclofenac and Fentanyl  Family History  Problem Relation Age of Onset   . Hypertension Mother   . Hypertension Father   . Cancer Paternal Aunt   . Cancer Paternal Grandmother   . Cancer Paternal Grandfather     Social History Social History  Substance Use Topics  . Smoking status: Never Smoker  . Smokeless tobacco: Never Used  . Alcohol use Yes     Comment: rarely    Review of Systems Constitutional: Negative for fever. Cardiovascular: Positive for chest pain. Respiratory: Negative for shortness of breath. Gastrointestinal: Negative for abdominal pain Musculoskeletal: Negative for back pain Neurological: Negative for headache 10-point ROS otherwise negative.  ____________________________________________   PHYSICAL EXAM:  VITAL SIGNS: ED Triage Vitals  Enc Vitals Group     BP 04/17/16 0418 (!) 133/91     Pulse Rate 04/17/16 0418 (!) 54     Resp 04/17/16 0418 20     Temp 04/17/16 0418 97.6 F (36.4 C)     Temp Source 04/17/16 0418 Oral     SpO2 04/17/16 0418 98 %     Weight --      Height --      Head Circumference --      Peak Flow --      Pain Score 04/17/16 0428 6     Pain Loc --      Pain Edu? --      Excl. in [place]? --     Constitutional: Alert and oriented. Well appearing and in no distress. Eyes: Normal exam  ENT   Head: Normocephalic and atraumatic   Mouth/Throat: Mucous membranes are moist. Cardiovascular: Normal rate, regular rhythm. No murmur. Moderate central chest tenderness to palpation. Respiratory: Normal respiratory effort without tachypnea nor retractions. Breath sounds are clear and equal bilaterally. No wheezes/rales/rhonchi. Gastrointestinal: Soft and nontender. No distention. Musculoskeletal: Nontender with normal range of motion in all extremities. No lower extremity tenderness or edema. Neurologic:  Normal speech and language. No gross focal neurologic deficits  Skin:  Skin is warm, dry and intact.  Psychiatric: Mood and affect are normal.  ____________________________________________     EKG  EKG reviewed and interpreted by myself shows sinus bradycardia at 53 bpm, narrow QRS, normal axis, normal intervals, no ST changes. Overall normal EKG.  ____________________________________________    RADIOLOGY  Chest x-ray negative  ____________________________________________   INITIAL IMPRESSION / ASSESSMENT AND PLAN / ED COURSE  Pertinent labs & imaging results that were available during my care of the patient were reviewed by me and considered in my medical decision making (see chart for details).  The patient presents the emergency department with chest pain which began approximately 3 hours ago. Patient states very mild chest pain currently. The chest pain is somewhat reproducible on exam. We will check labs, chest x-ray. Patient's EKG is reassuring.  Patient's labs are within normal limits. Chest x-ray negative. Troponin negative. We will repeat a troponin at the two-hour mark, if negative we will discharge home with cardiology follow-up.  ____________________________________________   FINAL CLINICAL IMPRESSION(S) / ED DIAGNOSES  Chest pain   Harvest Dark, MD 04/18/16 [phone]

## 2016-04-17 NOTE — ED Notes (Signed)
Blood sent to lab for repeat troponin.

## 2016-04-17 NOTE — Discharge Instructions (Signed)
You have been seen in the emergency department today for chest pain. Your workup has shown normal results. As we discussed please follow-up with your primary care physician in the next 1-2 days for recheck. Return to the emergency department for any further chest pain, trouble breathing, or any other symptom personally concerning to yourself. °

## 2016-04-17 NOTE — ED Triage Notes (Signed)
Pt presents w/ c/o chest pain starting at work. Pt states he is still having chest pain but it is not as severe as it was. Pt seen while at work by EMS who wanted to transport him to [facility] and he refused. Pt states the pain has persisted which is what brings him to the hospital tonight. Pt drove himself tonight, wife is on the way.

## 2016-04-17 NOTE — ED Provider Notes (Signed)
-----------------------------------------   8:57 AM on 04/17/2016 -----------------------------------------  Patient with no pain at this time, you to go home. According to Dr.paduchowski's plan, if second troponin is negative patient is to be discharged. Discharge paper work is ordered and prepared. Patient has no ongoing complaints, and according to the plan of the physician who took care of him he is to go home. I did discuss with the patient return precautions and close follow-up and he is in complete agreement with this plan. He understands he should come back anytime should he feel worse.   Schuyler Amor, MD 04/17/16 [phone]

## 2016-04-20 ENCOUNTER — Telehealth: Payer: Self-pay | Admitting: Cardiovascular Disease

## 2016-04-20 NOTE — Telephone Encounter (Signed)
lmov to call back and make an ED fu was seen in ED for CP

## 2016-06-30 ENCOUNTER — Inpatient Hospital Stay (HOSPITAL_BASED_OUTPATIENT_CLINIC_OR_DEPARTMENT_OTHER): Payer: 59 | Admitting: Hematology and Oncology

## 2016-06-30 ENCOUNTER — Inpatient Hospital Stay: Payer: 59 | Attending: Hematology and Oncology

## 2016-06-30 ENCOUNTER — Encounter: Payer: Self-pay | Admitting: Hematology and Oncology

## 2016-06-30 VITALS — BP 126/78 | HR 76 | Temp 94.5°F | Resp 18 | Wt 240.5 lb

## 2016-06-30 DIAGNOSIS — M79605 Pain in left leg: Secondary | ICD-10-CM | POA: Diagnosis not present

## 2016-06-30 DIAGNOSIS — C9140 Hairy cell leukemia not having achieved remission: Secondary | ICD-10-CM | POA: Insufficient documentation

## 2016-06-30 DIAGNOSIS — R17 Unspecified jaundice: Secondary | ICD-10-CM

## 2016-06-30 DIAGNOSIS — Z79899 Other long term (current) drug therapy: Secondary | ICD-10-CM | POA: Insufficient documentation

## 2016-06-30 DIAGNOSIS — M79604 Pain in right leg: Secondary | ICD-10-CM | POA: Diagnosis not present

## 2016-06-30 DIAGNOSIS — Z9049 Acquired absence of other specified parts of digestive tract: Secondary | ICD-10-CM | POA: Diagnosis not present

## 2016-06-30 LAB — COMPREHENSIVE METABOLIC PANEL
ALT: 23 U/L (ref 17–63)
AST: 32 U/L (ref 15–41)
Albumin: 4.7 g/dL (ref 3.5–5.0)
Alkaline Phosphatase: 74 U/L (ref 38–126)
Anion gap: 8 (ref 5–15)
BUN: 22 mg/dL — ABNORMAL HIGH (ref 6–20)
CO2: 23 mmol/L (ref 22–32)
Calcium: 9.6 mg/dL (ref 8.9–10.3)
Chloride: 106 mmol/L (ref 101–111)
Creatinine, Ser: 1.28 mg/dL — ABNORMAL HIGH (ref 0.61–1.24)
GFR calc Af Amer: >60 mL/min (ref >60)
GFR calc non Af Amer: >60 mL/min (ref >60)
Glucose, Bld: 167 mg/dL — ABNORMAL HIGH (ref 65–99)
Potassium: 3.6 mmol/L (ref 3.5–5.1)
Sodium: 137 mmol/L (ref 135–145)
Total Bilirubin: 1.3 mg/dL — ABNORMAL HIGH (ref 0.3–1.2)
Total Protein: 8.1 g/dL (ref 6.5–8.1)

## 2016-06-30 LAB — CBC WITH DIFFERENTIAL/PLATELET
Basophils Absolute: 0 10*3/uL (ref 0–0.1)
Basophils Relative: 1 %
Eosinophils Absolute: 0.2 10*3/uL (ref 0–0.7)
Eosinophils Relative: 3 %
HCT: 45.5 % (ref 40.0–52.0)
Hemoglobin: 16.4 g/dL (ref 13.0–18.0)
Lymphocytes Relative: 22 %
Lymphs Abs: 1.4 10*3/uL (ref 1.0–3.6)
MCH: 30.9 pg (ref 26.0–34.0)
MCHC: 36 g/dL (ref 32.0–36.0)
MCV: 85.8 fL (ref 80.0–100.0)
Monocytes Absolute: 0.3 10*3/uL (ref 0.2–1.0)
Monocytes Relative: 4 %
Neutro Abs: 4.3 10*3/uL (ref 1.4–6.5)
Neutrophils Relative %: 70 %
Platelets: 136 10*3/uL — ABNORMAL LOW (ref 150–440)
RBC: 5.3 MIL/uL (ref 4.40–5.90)
RDW: 13.5 % (ref 11.5–14.5)
WBC: 6.2 10*3/uL (ref 3.8–10.6)

## 2016-06-30 LAB — BILIRUBIN, DIRECT: Bilirubin, Direct: 0.1 mg/dL (ref 0.1–0.5)

## 2016-06-30 NOTE — Progress Notes (Signed)
[facility] day:  06/30/16  Chief Complaint: Kenneth Labs Maka [name] is an 40 y.o. 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  HPI: The patient was last seen in the medical oncology clinic on 02/29/2016. At that time, he had intermittent left sided abdominal discomfort.  Exam was unremarkable.  Platelet count had improved (135,000).  During the interim, he has felt "okay".  He notes fatigue.  He has a little pain in his left side. He has tried Nexium.  Pain is dull and feels like a knot.  He associates the pain with his spleen.  He had a sinus infection and was treated with a Z-Pak on 06/15/2016.   Past Medical History:  Diagnosis Date  . Allergy    seasonal  . Hairy cell leukemia ([place])   . Hairy cell leukemia ([place]) 02/09/2015    Past Surgical History:  Procedure Laterality Date  . CHOLECYSTECTOMY  12/15/14  . HERNIA REPAIR  2011  . INGUINAL HERNIA REPAIR    . ROTATOR CUFF REPAIR Right 2009, 2010    Family History  Problem Relation Age of Onset  . Hypertension Mother   . Hypertension Father   . Cancer Paternal Aunt   . Cancer Paternal Grandmother   . Cancer Paternal Grandfather     Social History:  reports that he has never smoked. He has never used smokeless tobacco. He reports that he drinks alcohol. He reports that he does not use drugs.   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  The patient is accompanied by his wife today.  Allergies:  Allergies  Allergen Reactions  . Fentanyl Other (See Comments)    Pt states " knocks me out for days, I cant handle it at all"  . Diclofenac Other (See Comments)    GI DISTRESS   Current Medications: Current Outpatient Prescriptions  Medication Sig Dispense Refill  . Creatine 5000 MG PACK Take 5,000 mg by mouth.    Marland Kitchen ibuprofen (ADVIL,MOTRIN) 200 MG tablet Take 200 mg by mouth every 6  (six) hours as needed.    . Multiple Vitamin (MULTIVITAMIN) tablet Take 1 tablet by mouth daily.    . Omega-3 Fatty Acids (FISH OIL) 500 MG CAPS Take 500 capsules by mouth.     No current facility-administered medications for this visit.    Review of Systems:  GENERAL:  Feels "ok".  Fatigue.  No fevers or sweats.  Weight down 7 pounds. PERFORMANCE STATUS (ECOG):  0 HEENT:  Interval sinus infection.  No visual changes, runny nose, sore throat, mouth sores or tenderness. Lungs: No shortness of breath or cough.  No hemoptysis. Cardiac:  No chest pain, palpitations, orthopnea, or PND. GI:  Left sided abdominal discomfort at times.  Tried Nexium.  No nausea, vomiting, diarrhea, constipation, melena or hematochezia. GU:  No urgency, frequency, dysuria, or hematuria. Musculoskeletal:  No back pain.  No joint pain.  No muscle tenderness. Extremities:  No pain or swelling. Skin:  No rashes, ulcers, or skin changes.  Neuro:  No headache, numbness or weakness, balance or coordination issues. Endocrine:  No diabetes, thyroid issues, hot flashes or night sweats. Psych:  No mood changes, depression or anxiety. Pain:  No focal pain. Review of systems:  All other systems reviewed and found to be negative.   Physical Exam: Blood pressure 126/78, pulse  76, temperature (!) 94.5 F (34.7 C), temperature source Tympanic, resp. rate 18, weight 240 lb 8.4 oz (109.1 kg).  Blood pressure 130/75, pulse 70, temperature 96.8 F (36 C), temperature source Tympanic, resp. rate 18, weight 246 pounds 5.8 oz (111.75 kg) GENERAL:  Well developed, well nourished, muscular gentleman sitting comfortably in the exam room in no acute distress. MENTAL STATUS:  Alert and oriented to person, place and time. HEAD:  Alopecia.  Short beard.  Normocephalic, atraumatic, face symmetric, no Cushingoid features. EYES:  Pupils equal round and reactive to light and accomodation.  No conjunctivitis or scleral icterus. ENT:  Oropharynx  clear without lesion.  Tongue normal. Mucous membranes moist.  RESPIRATORY:  Clear to auscultation without rales, wheezes or rhonchi. CARDIOVASCULAR:  Regular rate and rhythm without murmur, rub or gallop. ABDOMEN:  Soft, non-tender, with active bowel sounds, and no hepatosplenomegaly. SKIN:  No rashes or ulcers.  EXTREMITIES: No edema, no skin discoloration or tenderness.  No palpable cords. LYMPH NODES: No palpable cervical, supraclavicular, axillary or inguinal adenopathy  NEUROLOGICAL: Unremarkable. PSYCH:  Appropriate.   Appointment on 06/30/2016  Component Date Value Ref Range Status  . WBC 06/30/2016 6.2  3.8 - 10.6 K/uL Final  . RBC 06/30/2016 5.30  4.40 - 5.90 MIL/uL Final  . Hemoglobin 06/30/2016 16.4  13.0 - 18.0 g/dL Final  . HCT 06/30/2016 45.5  40.0 - 52.0 % Final  . MCV 06/30/2016 85.8  80.0 - 100.0 fL Final  . MCH 06/30/2016 30.9  26.0 - 34.0 pg Final  . MCHC 06/30/2016 36.0  32.0 - 36.0 g/dL Final  . RDW 06/30/2016 13.5  11.5 - 14.5 % Final  . Platelets 06/30/2016 136* 150 - 440 K/uL Final  . Neutrophils Relative % 06/30/2016 70  % Final  . Neutro Abs 06/30/2016 4.3  1.4 - 6.5 K/uL Final  . Lymphocytes Relative 06/30/2016 22  % Final  . Lymphs Abs 06/30/2016 1.4  1.0 - 3.6 K/uL Final  . Monocytes Relative 06/30/2016 4  % Final  . Monocytes Absolute 06/30/2016 0.3  0.2 - 1.0 K/uL Final  . Eosinophils Relative 06/30/2016 3  % Final  . Eosinophils Absolute 06/30/2016 0.2  0 - 0.7 K/uL Final  . Basophils Relative 06/30/2016 1  % Final  . Basophils Absolute 06/30/2016 0.0  0 - 0.1 K/uL Final  . Sodium 06/30/2016 137  135 - 145 mmol/L Final  . Potassium 06/30/2016 3.6  3.5 - 5.1 mmol/L Final  . Chloride 06/30/2016 106  101 - 111 mmol/L Final  . CO2 06/30/2016 23  22 - 32 mmol/L Final  . Glucose, Bld 06/30/2016 167* 65 - 99 mg/dL Final  . BUN 06/30/2016 22* 6 - 20 mg/dL Final  . Creatinine, Ser 06/30/2016 1.28* 0.61 - 1.24 mg/dL Final  . Calcium 06/30/2016 9.6  8.9 -  10.3 mg/dL Final  . Total Protein 06/30/2016 8.1  6.5 - 8.1 g/dL Final  . Albumin 06/30/2016 4.7  3.5 - 5.0 g/dL Final  . AST 06/30/2016 32  15 - 41 U/L Final  . ALT 06/30/2016 23  17 - 63 U/L Final  . Alkaline Phosphatase 06/30/2016 74  38 - 126 U/L Final  . Total Bilirubin 06/30/2016 1.3* 0.3 - 1.2 mg/dL Final  . GFR calc non Af Amer 06/30/2016 >60  >60 mL/min Final  . GFR calc Af Amer 06/30/2016 >60  >60 mL/min Final   Comment: (NOTE) The eGFR has been calculated using the CKD EPI equation. This calculation has not  been validated in all clinical situations. eGFR's persistently <60 mL/min signify possible Chronic Kidney Disease.   Georgiann Hahn gap 06/30/2016 8  5 - 15 Final   Assessment:  Kenneth Labs Baize [name] is an 40 y.o. male gentleman with hairy cell leukemia.  He presented with symptomatic splenomegaly and mild leukopenia and thrombocytopenia.  He is 21 months s/p cladribine (09/30/2014 - 10/07/2014).  Counts are normal except for a slightly low platelet count.    He has a history of severe abdominal pain. Abdominal and pelvic CT scan on 11/30/2014 revealed chronic cholelithiasis. He underwent cholecystectomy on 12/15/2014.  He has intermittent left sided abdominal pain.  Symptomatically, he feels has chronic intermittent left sided abdominal discomfort.  Exam reveals no splenomegaly.  Platelet count has improved (136,000).  Plan: 1.  Labs today:  CBC with diff, CMP, direct bilirubin. 2.  Anticipate follow-up every 6 months after next visit. 3.  RTC in 4 months for MD assessment and labs (CBC with diff, CMP).   Lequita Asal, MD  06/30/2016, 11:05 AM

## 2016-06-30 NOTE — Progress Notes (Signed)
Patient states he just completed a Z-Pak for a sinus infection.  Also states he has pain in his back and feet.  Further states he has discomfort in his left abdomen.  He has seen Dr. Kary Kos regarding this and he feels it has to do with his digestion process.  Patient also wants to know if it is okay to get a flu vaccine.  Does not want to get it here due to cost.  Will get it elsewhere.

## 2016-07-02 ENCOUNTER — Other Ambulatory Visit: Payer: Self-pay | Admitting: *Deleted

## 2016-07-02 DIAGNOSIS — C9140 Hairy cell leukemia not having achieved remission: Secondary | ICD-10-CM

## 2016-07-17 ENCOUNTER — Telehealth: Payer: Self-pay | Admitting: *Deleted

## 2016-07-17 DIAGNOSIS — C9140 Hairy cell leukemia not having achieved remission: Secondary | ICD-10-CM

## 2016-07-17 NOTE — Telephone Encounter (Signed)
Patient not sue he can make appt, but will call back to cnl if necessary

## 2016-07-17 NOTE — Telephone Encounter (Signed)
Asking to be seen tomorrow for a "rash " he has developed on bil lower legs from knees down, pink to purple in o color. Denies unusual bruising or bleeding. Please advise

## 2016-07-17 NOTE — Telephone Encounter (Signed)
Per MD see hime with CBC tomorrow, 10:30 appt time given, Left message on pt VM to call me back to confirm appt

## 2016-07-18 ENCOUNTER — Inpatient Hospital Stay (HOSPITAL_BASED_OUTPATIENT_CLINIC_OR_DEPARTMENT_OTHER): Payer: 59 | Admitting: Hematology and Oncology

## 2016-07-18 ENCOUNTER — Other Ambulatory Visit: Payer: Self-pay

## 2016-07-18 ENCOUNTER — Inpatient Hospital Stay: Payer: 59

## 2016-07-18 VITALS — BP 138/85 | HR 66 | Temp 95.7°F | Resp 18 | Wt 246.7 lb

## 2016-07-18 DIAGNOSIS — C9140 Hairy cell leukemia not having achieved remission: Secondary | ICD-10-CM | POA: Diagnosis not present

## 2016-07-18 DIAGNOSIS — M79605 Pain in left leg: Secondary | ICD-10-CM

## 2016-07-18 DIAGNOSIS — M79604 Pain in right leg: Secondary | ICD-10-CM

## 2016-07-18 LAB — CBC WITH DIFFERENTIAL/PLATELET
Basophils Absolute: 0 10*3/uL (ref 0–0.1)
Basophils Relative: 1 %
Eosinophils Absolute: 0.1 10*3/uL (ref 0–0.7)
Eosinophils Relative: 2 %
HCT: 45.4 % (ref 40.0–52.0)
Hemoglobin: 16 g/dL (ref 13.0–18.0)
Lymphocytes Relative: 23 %
Lymphs Abs: 1.5 10*3/uL (ref 1.0–3.6)
MCH: 30.5 pg (ref 26.0–34.0)
MCHC: 35.3 g/dL (ref 32.0–36.0)
MCV: 86.5 fL (ref 80.0–100.0)
Monocytes Absolute: 0.5 10*3/uL (ref 0.2–1.0)
Monocytes Relative: 7 %
Neutro Abs: 4.5 10*3/uL (ref 1.4–6.5)
Neutrophils Relative %: 67 %
Platelets: 140 10*3/uL — ABNORMAL LOW (ref 150–440)
RBC: 5.26 MIL/uL (ref 4.40–5.90)
RDW: 13.4 % (ref 11.5–14.5)
WBC: 6.7 10*3/uL (ref 3.8–10.6)

## 2016-07-18 NOTE — Progress Notes (Signed)
[facility] day:  07/18/16  Chief Complaint: Kenneth Labs Wight [name] is an 40 y.o. 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  HPI: The patient was last seen in the medical oncology clinic on 06/30/2016. At that time, he noted fatigue.  He described intermittent left sided abdominal discomfort.  Exam was unremarkable.  Platelet count was 136,000.  During the interim, he notes a discomfort in his legs like a burn for the past 5-6 days.  He denies any lower extremity color change.  He denies any trauma.  He denies any symptoms of claudication.  He has been working and notes a job change in December.   Past Medical History:  Diagnosis Date  . Allergy    seasonal  . Hairy cell leukemia ([place])   . Hairy cell leukemia ([place]) 02/09/2015    Past Surgical History:  Procedure Laterality Date  . CHOLECYSTECTOMY  12/15/14  . HERNIA REPAIR  2011  . INGUINAL HERNIA REPAIR    . ROTATOR CUFF REPAIR Right 2009, 2010    Family History  Problem Relation Age of Onset  . Hypertension Mother   . Hypertension Father   . Cancer Paternal Aunt   . Cancer Paternal Grandmother   . Cancer Paternal Grandfather     Social History:  reports that he has never smoked. He has never used smokeless tobacco. He reports that he drinks alcohol. He reports that he does not use drugs.   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  The patient is alone today.  Allergies:  Allergies  Allergen Reactions  . Fentanyl Other (See Comments)    Pt states " knocks me out for days, I cant handle it at all"  . Diclofenac Other (See Comments)    GI DISTRESS   Current Medications: Current Outpatient Prescriptions  Medication Sig Dispense Refill  . Creatine 5000 MG PACK Take 5,000 mg by mouth.    Marland Kitchen ibuprofen (ADVIL,MOTRIN) 200 MG  tablet Take 200 mg by mouth every 6 (six) hours as needed.    . Multiple Vitamin (MULTIVITAMIN) tablet Take 1 tablet by mouth daily.    . Omega-3 Fatty Acids (FISH OIL) 500 MG CAPS Take 500 capsules by mouth.     No current facility-administered medications for this visit.    Review of Systems:  GENERAL:  Feels "ok".  Active.  No fevers or sweats.  Weight up 6 pounds. PERFORMANCE STATUS (ECOG):  0 HEENT:  No visual changes, runny nose, sore throat, mouth sores or tenderness. Lungs: No shortness of breath or cough.  No hemoptysis. Cardiac:  No chest pain, palpitations, orthopnea, or PND. GI:  Left upper quadrant discomfort at times.  No nausea, vomiting, diarrhea, constipation, melena or hematochezia. GU:  No urgency, frequency, dysuria, or hematuria. Musculoskeletal:  Leg pain (see HPI).  No back pain.  No joint pain.  No muscle tenderness. Extremities:  No pain or swelling. Skin:  No rashes, ulcers, or skin changes.  Neuro:  No headache, numbness or weakness, balance or coordination issues. Endocrine:  No diabetes, thyroid issues, hot flashes or night sweats. Psych:  No mood changes, depression or anxiety. Pain:  No focal pain. Review of systems:  All other systems reviewed and found to be negative.   Physical Exam:  Blood pressure 138/85, pulse 66, temperature (!) 95.7 F (35.4 C), temperature source Tympanic, resp. rate 18, weight 246 lb 11.1 oz (111.9 kg).  GENERAL:  Well developed, well nourished, muscular gentleman sitting comfortably in the exam room in no acute distress. MENTAL STATUS:  Alert and oriented to person, place and time. HEAD:  Alopecia.  Short beard.  Normocephalic, atraumatic, face symmetric, no Cushingoid features. EYES:  No conjunctivitis or scleral icterus. RESPIRATORY:  Clear to auscultation without rales, wheezes or rhonchi. CARDIOVASCULAR:  Regular rate and rhythm without murmur, rub or gallop. ABDOMEN:  Soft, non-tender, with active bowel sounds, and no  hepatosplenomegaly.  EXTREMITIES:  Warm.  No erythema.  No edema or skin discoloration.  No tenderness.  No palpable cords. LYMPH NODES: No inguinal adenopathy  NEUROLOGICAL: Unremarkable. PSYCH:  Appropriate.   Orders Only on 07/18/2016  Component Date Value Ref Range Status  . WBC 07/18/2016 6.7  3.8 - 10.6 K/uL Final  . RBC 07/18/2016 5.26  4.40 - 5.90 MIL/uL Final  . Hemoglobin 07/18/2016 16.0  13.0 - 18.0 g/dL Final  . HCT 07/18/2016 45.4  40.0 - 52.0 % Final  . MCV 07/18/2016 86.5  80.0 - 100.0 fL Final  . MCH 07/18/2016 30.5  26.0 - 34.0 pg Final  . MCHC 07/18/2016 35.3  32.0 - 36.0 g/dL Final  . RDW 07/18/2016 13.4  11.5 - 14.5 % Final  . Platelets 07/18/2016 140* 150 - 440 K/uL Final  . Neutrophils Relative % 07/18/2016 67  % Final  . Neutro Abs 07/18/2016 4.5  1.4 - 6.5 K/uL Final  . Lymphocytes Relative 07/18/2016 23  % Final  . Lymphs Abs 07/18/2016 1.5  1.0 - 3.6 K/uL Final  . Monocytes Relative 07/18/2016 7  % Final  . Monocytes Absolute 07/18/2016 0.5  0.2 - 1.0 K/uL Final  . Eosinophils Relative 07/18/2016 2  % Final  . Eosinophils Absolute 07/18/2016 0.1  0 - 0.7 K/uL Final  . Basophils Relative 07/18/2016 1  % Final  . Basophils Absolute 07/18/2016 0.0  0 - 0.1 K/uL Final   Assessment:  Kenneth Scurry. is an 40 y.o. male gentleman with hairy cell leukemia.  He presented with symptomatic splenomegaly and mild leukopenia and thrombocytopenia.  He is 21 months s/p cladribine (09/30/2014 - 10/07/2014).  Counts are normal except for a slightly low platelet count.    He has a history of severe abdominal pain. Abdominal and pelvic CT scan on 11/30/2014 revealed chronic cholelithiasis. He underwent cholecystectomy on 12/15/2014.   He has intermittent left sided abdominal discomfort.  Symptomatically, he has a 5-6 day history of bilateral leg discomfort.  Exam is unremarkable.  Platelet count has improved (140,000).  Plan: 1.  Labs today:  CBC with diff. 2.   Reassurance. 3.  RTC as previously scheduled.   Lequita Asal, MD  07/18/2016, 11:53 AM

## 2016-08-04 ENCOUNTER — Telehealth: Payer: Self-pay | Admitting: *Deleted

## 2016-08-04 ENCOUNTER — Other Ambulatory Visit: Payer: Self-pay | Admitting: Hematology and Oncology

## 2016-08-04 DIAGNOSIS — R1012 Left upper quadrant pain: Secondary | ICD-10-CM

## 2016-08-04 NOTE — Telephone Encounter (Signed)
Called to report that he is still having abd pain and is requesting we order a CT to be done on Monday if at all possible

## 2016-08-04 NOTE — Telephone Encounter (Signed)
Advised patient that we cannot get a CT scheduled for Monday and that if he is concerned with pain being bad enough that he can go to ER for evaluation and if they deem it necessary they can get a CT ordered and done quickly. He states he understands and that he would like for her to order a CT for when it can be done next week. He is concerned about his spleen and the fact that he has "a mess in there that might have moved" and "has had this pain that is not getting any better for months now"

## 2016-08-04 NOTE — Telephone Encounter (Signed)
Called pt back and dr Mike Gip will order ct scan and we will schedule it next week and give a few days for insurance approval and then let him know the date.  He states that he has this ongoing pain left side at belly button level. He deals with it but it never goes away.

## 2016-08-09 ENCOUNTER — Telehealth: Payer: Self-pay | Admitting: *Deleted

## 2016-08-09 ENCOUNTER — Ambulatory Visit
Admission: RE | Admit: 2016-08-09 | Discharge: 2016-08-09 | Disposition: A | Discharge status: Home / Self Care | Payer: 59 | Source: Ambulatory Visit | Attending: Hematology and Oncology | Admitting: Hematology and Oncology

## 2016-08-09 DIAGNOSIS — K439 Ventral hernia without obstruction or gangrene: Secondary | ICD-10-CM | POA: Insufficient documentation

## 2016-08-09 DIAGNOSIS — R1012 Left upper quadrant pain: Secondary | ICD-10-CM | POA: Insufficient documentation

## 2016-08-09 DIAGNOSIS — K4090 Unilateral inguinal hernia, without obstruction or gangrene, not specified as recurrent: Secondary | ICD-10-CM | POA: Diagnosis not present

## 2016-08-09 DIAGNOSIS — D1803 Hemangioma of intra-abdominal structures: Secondary | ICD-10-CM | POA: Insufficient documentation

## 2016-08-09 MED ORDER — IOPAMIDOL (ISOVUE-370) INJECTION 76%
100.0000 mL | Freq: Once | INTRAVENOUS | Status: AC | PRN
  Administered 2016-08-09: 100 mL via INTRAVENOUS

## 2016-08-09 NOTE — Telephone Encounter (Signed)
Called patient to inform him that his CT is normal.  Patient verbalized understanding.  Grateful for call.

## 2016-08-09 NOTE — Telephone Encounter (Signed)
-----   Message from Lequita Asal, MD sent at 08/09/2016  1:50 PM EST ----- Regarding: Please call patient  CT normal.  M  ----- Message ----- From: Interface, Rad Results In Sent: 08/09/2016   1:48 PM To: Lequita Asal, MD

## 2016-09-03 ENCOUNTER — Encounter: Payer: Self-pay | Admitting: Hematology and Oncology

## 2016-10-27 ENCOUNTER — Inpatient Hospital Stay (HOSPITAL_BASED_OUTPATIENT_CLINIC_OR_DEPARTMENT_OTHER): Payer: Self-pay | Admitting: Hematology and Oncology

## 2016-10-27 ENCOUNTER — Other Ambulatory Visit: Payer: Self-pay

## 2016-10-27 ENCOUNTER — Inpatient Hospital Stay: Payer: Self-pay | Attending: Hematology and Oncology

## 2016-10-27 VITALS — BP 135/95 | HR 80 | Temp 95.4°F | Resp 18 | Wt 247.4 lb

## 2016-10-27 DIAGNOSIS — Z9049 Acquired absence of other specified parts of digestive tract: Secondary | ICD-10-CM | POA: Insufficient documentation

## 2016-10-27 DIAGNOSIS — C9140 Hairy cell leukemia not having achieved remission: Secondary | ICD-10-CM

## 2016-10-27 DIAGNOSIS — D696 Thrombocytopenia, unspecified: Secondary | ICD-10-CM

## 2016-10-27 DIAGNOSIS — Z79899 Other long term (current) drug therapy: Secondary | ICD-10-CM | POA: Insufficient documentation

## 2016-10-27 LAB — COMPREHENSIVE METABOLIC PANEL
ALT: 27 U/L (ref 17–63)
AST: 25 U/L (ref 15–41)
Albumin: 4.6 g/dL (ref 3.5–5.0)
Alkaline Phosphatase: 80 U/L (ref 38–126)
Anion gap: 6 (ref 5–15)
BUN: 18 mg/dL (ref 6–20)
CO2: 26 mmol/L (ref 22–32)
Calcium: 9.8 mg/dL (ref 8.9–10.3)
Chloride: 108 mmol/L (ref 101–111)
Creatinine, Ser: 1.11 mg/dL (ref 0.61–1.24)
GFR calc Af Amer: >60 mL/min (ref >60)
GFR calc non Af Amer: >60 mL/min (ref >60)
Glucose, Bld: 107 mg/dL — ABNORMAL HIGH (ref 65–99)
Potassium: 4.2 mmol/L (ref 3.5–5.1)
Sodium: 140 mmol/L (ref 135–145)
Total Bilirubin: 0.6 mg/dL (ref 0.3–1.2)
Total Protein: 8.2 g/dL — ABNORMAL HIGH (ref 6.5–8.1)

## 2016-10-27 LAB — CBC WITH DIFFERENTIAL/PLATELET
Basophils Absolute: 0 10*3/uL (ref 0–0.1)
Basophils Relative: 1 %
Eosinophils Absolute: 0.2 10*3/uL (ref 0–0.7)
Eosinophils Relative: 4 %
HCT: 46.6 % (ref 40.0–52.0)
Hemoglobin: 16.4 g/dL (ref 13.0–18.0)
Lymphocytes Relative: 27 %
Lymphs Abs: 1.2 10*3/uL (ref 1.0–3.6)
MCH: 30.3 pg (ref 26.0–34.0)
MCHC: 35.3 g/dL (ref 32.0–36.0)
MCV: 85.9 fL (ref 80.0–100.0)
Monocytes Absolute: 0.3 10*3/uL (ref 0.2–1.0)
Monocytes Relative: 8 %
Neutro Abs: 2.7 10*3/uL (ref 1.4–6.5)
Neutrophils Relative %: 60 %
Platelets: 118 10*3/uL — ABNORMAL LOW (ref 150–440)
RBC: 5.42 MIL/uL (ref 4.40–5.90)
RDW: 13.2 % (ref 11.5–14.5)
WBC: 4.5 10*3/uL (ref 3.8–10.6)

## 2016-10-27 NOTE — Progress Notes (Signed)
Patient offers no complaints today.  Rashes on legs are unchanged.

## 2016-10-27 NOTE — Progress Notes (Signed)
[facility] day:  10/27/16  Chief Complaint: Kenneth Labs Frech [name] is an 41 y.o. male with hairy cell leukemia s/p cladribine 25 months ago who is seen for 4 month assessment.  HPI: The patient was last seen in the medical oncology clinic on 07/18/2016. At that time, he was seen for a sick call visit.  He had a 5-6 day history of bilateral leg discomfort.  Exam was unremarkable.  Platelet count had improved (140,000).  During the interim, he has done well.  He has felt more healthy.  He has had no further abdominal pain since he stopped working at [employer].  He feels more healthy.    Past Medical History:  Diagnosis Date  . Allergy    seasonal  . Hairy cell leukemia ([place])   . Hairy cell leukemia ([place]) 02/09/2015    Past Surgical History:  Procedure Laterality Date  . CHOLECYSTECTOMY  12/15/14  . HERNIA REPAIR  2011  . INGUINAL HERNIA REPAIR    . ROTATOR CUFF REPAIR Right 2009, 2010    Family History  Problem Relation Age of Onset  . Hypertension Mother   . Hypertension Father   . Cancer Paternal Aunt   . Cancer Paternal Grandmother   . Cancer Paternal Grandfather     Social History:  reports that he has never smoked. He has never used smokeless tobacco. He reports that he drinks alcohol. He reports that he does not use drugs.   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  He works out at [employer] regularly.  The patient is alone today.  Allergies:  Allergies  Allergen Reactions  . Fentanyl Other (See Comments)    Pt states " knocks me out for days, I cant handle it at all"  . Diclofenac Other (See Comments)    GI DISTRESS   Current Medications: Current Outpatient Prescriptions  Medication Sig Dispense Refill  . Creatine 5000 MG PACK Take 5,000 mg by mouth.    . hydrocortisone (PROCTOSOL HC) 2.5 % rectal cream APPLY TOPICALLY TWICE DAILY AS NEEDED  FOR HEMORRHOIDS    . ibuprofen (ADVIL,MOTRIN) 200 MG tablet Take 200 mg by mouth every 6 (six) hours as needed.    . Multiple Vitamin (MULTIVITAMIN) tablet Take 1 tablet by mouth daily.    . Omega-3 Fatty Acids (FISH OIL) 500 MG CAPS Take 500 capsules by mouth.     No current facility-administered medications for this visit.    Review of Systems:  GENERAL:  Feels healthy.  Active.  No fevers or sweats.  Weight up 1 pound. PERFORMANCE STATUS (ECOG):  0 HEENT:  No visual changes, runny nose, sore throat, mouth sores or tenderness. Lungs: No shortness of breath or cough.  No hemoptysis. Cardiac:  No chest pain, palpitations, orthopnea, or PND. GI:  No abdomina pain.  No nausea, vomiting, diarrhea, constipation, melena or hematochezia. GU:  No urgency, frequency, dysuria, or hematuria. Musculoskeletal:  No back pain.  No joint pain.  No muscle tenderness. Extremities:  No pain or swelling. Skin:  No rashes, ulcers, or skin changes.  Neuro:  No headache, numbness or weakness, balance or coordination issues. Endocrine:  No diabetes, thyroid issues, hot flashes or night sweats. Psych:  No mood changes, depression or anxiety. Pain:  No focal pain. Review of systems:  All other systems reviewed and found to be negative.  Physical Exam: Blood pressure (!) 135/95, pulse 80, temperature (!) 95.4 F (35.2 C), temperature source Tympanic, resp. rate 18, weight 247 lb 5.7 oz (112.2 kg).  GENERAL:  Well developed, well nourished, muscular gentleman sitting comfortably in the exam room in no acute distress. MENTAL STATUS:  Alert and oriented to person, place and time. HEAD:  Alopecia.  Normocephalic, atraumatic, face symmetric, no Cushingoid features. EYES:  Pupils equal round and reactive to light and accomodation.  No conjunctivitis or scleral icterus. ENT:  Oropharynx clear without lesion.  Tongue normal. Mucous membranes moist.  RESPIRATORY:  Clear to auscultation without rales, wheezes or  rhonchi. CARDIOVASCULAR:  Regular rate and rhythm without murmur, rub or gallop. ABDOMEN:  Soft, non-tender, with active bowel sounds, and no hepatosplenomegaly. SKIN:  No rashes or ulcers.  EXTREMITIES: No edema, no skin discoloration or tenderness.  No palpable cords. LYMPH NODES: No palpable cervical, supraclavicular, axillary or inguinal adenopathy  NEUROLOGICAL: Unremarkable. PSYCH:  Appropriate.   Orders Only on 10/27/2016  Component Date Value Ref Range Status  . WBC 10/27/2016 4.5  3.8 - 10.6 K/uL Final  . RBC 10/27/2016 5.42  4.40 - 5.90 MIL/uL Final  . Hemoglobin 10/27/2016 16.4  13.0 - 18.0 g/dL Final  . HCT 10/27/2016 46.6  40.0 - 52.0 % Final  . MCV 10/27/2016 85.9  80.0 - 100.0 fL Final  . MCH 10/27/2016 30.3  26.0 - 34.0 pg Final  . MCHC 10/27/2016 35.3  32.0 - 36.0 g/dL Final  . RDW 10/27/2016 13.2  11.5 - 14.5 % Final  . Platelets 10/27/2016 118* 150 - 440 K/uL Final  . Neutrophils Relative % 10/27/2016 60  % Final  . Neutro Abs 10/27/2016 2.7  1.4 - 6.5 K/uL Final  . Lymphocytes Relative 10/27/2016 27  % Final  . Lymphs Abs 10/27/2016 1.2  1.0 - 3.6 K/uL Final  . Monocytes Relative 10/27/2016 8  % Final  . Monocytes Absolute 10/27/2016 0.3  0.2 - 1.0 K/uL Final  . Eosinophils Relative 10/27/2016 4  % Final  . Eosinophils Absolute 10/27/2016 0.2  0 - 0.7 K/uL Final  . Basophils Relative 10/27/2016 1  % Final  . Basophils Absolute 10/27/2016 0.0  0 - 0.1 K/uL Final  . Sodium 10/27/2016 140  135 - 145 mmol/L Final  . Potassium 10/27/2016 4.2  3.5 - 5.1 mmol/L Final  . Chloride 10/27/2016 108  101 - 111 mmol/L Final  . CO2 10/27/2016 26  22 - 32 mmol/L Final  . Glucose, Bld 10/27/2016 107* 65 - 99 mg/dL Final  . BUN 10/27/2016 18  6 - 20 mg/dL Final  . Creatinine, Ser 10/27/2016 1.11  0.61 - 1.24 mg/dL Final  . Calcium 10/27/2016 9.8  8.9 - 10.3 mg/dL Final  . Total Protein 10/27/2016 8.2* 6.5 - 8.1 g/dL Final  . Albumin 10/27/2016 4.6  3.5 - 5.0 g/dL Final  .  AST 10/27/2016 25  15 - 41 U/L Final  . ALT 10/27/2016 27  17 - 63 U/L Final  . Alkaline Phosphatase 10/27/2016 80  38 - 126 U/L Final  . Total Bilirubin 10/27/2016 0.6  0.3 - 1.2 mg/dL Final  . GFR calc non Af Amer 10/27/2016 >60  >60 mL/min Final  . GFR calc Af Amer 10/27/2016 >60  >60 mL/min Final   Comment: (NOTE) The eGFR has been calculated using the CKD EPI equation. This calculation has not been validated in all clinical situations. eGFR's persistently <60 mL/min signify possible Chronic Kidney Disease.   Marland Kitchen  Anion gap 10/27/2016 6  5 - 15 Final   Assessment:  Kenneth Hari. is an 41 y.o. male gentleman with hairy cell leukemia.  He presented with symptomatic splenomegaly and mild leukopenia and thrombocytopenia.  He is 25 months s/p cladribine (09/30/2014 - 10/07/2014).  Counts are normal except for a slightly low platelet count.    He has a history of severe abdominal pain. Abdominal and pelvic CT scan on 11/30/2014 revealed chronic cholelithiasis. He underwent cholecystectomy on 12/15/2014.   Abdominal pain resolved after changing jobs.  Symptomatically, he feels good.  Exam is unremarkable.  Platelet count is 118,000.  Plan: 1.  Labs today:  CBC with diff, CMP. 2.  Discuss plans for follow-up after next visit in 6 months if patient feels good and platelet count is good. 3.  RTC in 4 months for MD assess and labs (CBC with diff,CMP).   Lequita Asal, MD  10/27/2016, 10:12 AM

## 2016-10-29 ENCOUNTER — Encounter: Payer: Self-pay | Admitting: Hematology and Oncology

## 2017-02-26 ENCOUNTER — Inpatient Hospital Stay: Payer: Self-pay

## 2017-02-26 ENCOUNTER — Inpatient Hospital Stay: Payer: Self-pay | Admitting: Hematology and Oncology

## 2017-04-10 ENCOUNTER — Encounter: Payer: Self-pay | Admitting: Hematology and Oncology

## 2017-04-10 ENCOUNTER — Inpatient Hospital Stay: Payer: BC Managed Care – PPO

## 2017-04-10 ENCOUNTER — Inpatient Hospital Stay: Payer: BC Managed Care – PPO | Attending: Hematology and Oncology | Admitting: Hematology and Oncology

## 2017-04-10 ENCOUNTER — Other Ambulatory Visit: Payer: Self-pay | Admitting: Hematology and Oncology

## 2017-04-10 VITALS — BP 126/78 | HR 76 | Temp 97.6°F | Resp 20 | Wt 247.1 lb

## 2017-04-10 DIAGNOSIS — Z9221 Personal history of antineoplastic chemotherapy: Secondary | ICD-10-CM | POA: Diagnosis not present

## 2017-04-10 DIAGNOSIS — C9141 Hairy cell leukemia, in remission: Secondary | ICD-10-CM | POA: Diagnosis present

## 2017-04-10 DIAGNOSIS — C9140 Hairy cell leukemia not having achieved remission: Secondary | ICD-10-CM

## 2017-04-10 DIAGNOSIS — R17 Unspecified jaundice: Secondary | ICD-10-CM

## 2017-04-10 DIAGNOSIS — Z9049 Acquired absence of other specified parts of digestive tract: Secondary | ICD-10-CM | POA: Insufficient documentation

## 2017-04-10 LAB — COMPREHENSIVE METABOLIC PANEL
ALT: 26 U/L (ref 17–63)
AST: 27 U/L (ref 15–41)
Albumin: 4.5 g/dL (ref 3.5–5.0)
Alkaline Phosphatase: 76 U/L (ref 38–126)
Anion gap: 7 (ref 5–15)
BUN: 21 mg/dL — ABNORMAL HIGH (ref 6–20)
CO2: 21 mmol/L — ABNORMAL LOW (ref 22–32)
Calcium: 9.6 mg/dL (ref 8.9–10.3)
Chloride: 107 mmol/L (ref 101–111)
Creatinine, Ser: 1.22 mg/dL (ref 0.61–1.24)
GFR calc Af Amer: >60 mL/min (ref >60)
GFR calc non Af Amer: >60 mL/min (ref >60)
Glucose, Bld: 158 mg/dL — ABNORMAL HIGH (ref 65–99)
Potassium: 3.7 mmol/L (ref 3.5–5.1)
Sodium: 135 mmol/L (ref 135–145)
Total Bilirubin: 1.7 mg/dL — ABNORMAL HIGH (ref 0.3–1.2)
Total Protein: 8 g/dL (ref 6.5–8.1)

## 2017-04-10 LAB — CBC WITH DIFFERENTIAL/PLATELET
Basophils Absolute: 0 10*3/uL (ref 0–0.1)
Basophils Relative: 0 %
Eosinophils Absolute: 0.1 10*3/uL (ref 0–0.7)
Eosinophils Relative: 3 %
HCT: 46.3 % (ref 40.0–52.0)
Hemoglobin: 16.7 g/dL (ref 13.0–18.0)
Lymphocytes Relative: 29 %
Lymphs Abs: 1.5 10*3/uL (ref 1.0–3.6)
MCH: 30.8 pg (ref 26.0–34.0)
MCHC: 36.1 g/dL — ABNORMAL HIGH (ref 32.0–36.0)
MCV: 85.4 fL (ref 80.0–100.0)
Monocytes Absolute: 0.2 10*3/uL (ref 0.2–1.0)
Monocytes Relative: 4 %
Neutro Abs: 3.3 10*3/uL (ref 1.4–6.5)
Neutrophils Relative %: 64 %
Platelets: 139 10*3/uL — ABNORMAL LOW (ref 150–440)
RBC: 5.42 MIL/uL (ref 4.40–5.90)
RDW: 13 % (ref 11.5–14.5)
WBC: 5.2 10*3/uL (ref 3.8–10.6)

## 2017-04-10 LAB — BILIRUBIN, DIRECT: Bilirubin, Direct: 0.1 mg/dL (ref 0.1–0.5)

## 2017-04-10 NOTE — Progress Notes (Signed)
Patient states he has been fatigued after certain activities but has been working harder and doing more strenuous activity.  Patient states he is anxious about if his disease progresses.  He currently has a friend that has MM and he is stressed about him.  He is also having problems with obtaining health insurance.  He states he feels like most of wha he is feeling may just be psychological.

## 2017-04-10 NOTE — Progress Notes (Signed)
[facility] day:  04/10/17  Chief Complaint: [facility] Labs Dolney [name] 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  HPI: The patient was last seen in the medical oncology clinic on 10/27/2016. At that time, he was doing well.  He felt more healthy.  He has had no further abdominal pain since he stopped working at [employer].  CBC revealed a hematocrit of 46.6, hemoglobin 16.4, MCV 85.9, platelets 118,000, and WBC 4500 with an ANC of 2700.  During the interim, he has felt good. He has been slightly fatigued working harder and doing more strenuous activities. He remains concerned about the status of his disease. He has been active on the road with his daughter who was playing basketball.  He has a history of pilonidal cyst and has a sore bottom. He denies any fevers.   Past Medical History:  Diagnosis Date  . Allergy    seasonal  . Hairy cell leukemia ([place])   . Hairy cell leukemia ([place]) 02/09/2015    Past Surgical History:  Procedure Laterality Date  . CHOLECYSTECTOMY  12/15/14  . HERNIA REPAIR  2011  . INGUINAL HERNIA REPAIR    . ROTATOR CUFF REPAIR Right 2009, 2010    Family History  Problem Relation Age of Onset  . Hypertension Mother   . Hypertension Father   . Cancer Paternal Aunt   . Cancer Paternal Grandmother   . Cancer Paternal Grandfather     Social History:  reports that he has never smoked. He has never used smokeless tobacco. He reports that he drinks alcohol. He reports that he does not use drugs.   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  He works out at [employer] regularly.  The patient is alone today.  Allergies:  Allergies  Allergen Reactions  . Fentanyl Other (See Comments)    Pt states " knocks me out for days, I cant handle it at all"  . Diclofenac Other (See Comments)    GI  DISTRESS   Current Medications: Current Outpatient Prescriptions  Medication Sig Dispense Refill  . hydrocortisone (PROCTOSOL HC) 2.5 % rectal cream APPLY TOPICALLY TWICE DAILY AS NEEDED FOR HEMORRHOIDS    . ibuprofen (ADVIL,MOTRIN) 200 MG tablet Take 200 mg by mouth every 6 (six) hours as needed.    . Multiple Vitamin (MULTIVITAMIN) tablet Take 1 tablet by mouth daily.    . Omega-3 Fatty Acids (FISH OIL) 500 MG CAPS Take 500 capsules by mouth.    . Creatine 5000 MG PACK Take 5,000 mg by mouth.     No current facility-administered medications for this visit.    Review of Systems:  GENERAL:  Feels good.  Some fatigue with strenuous activity.  No fevers or sweats.  Weight stable. PERFORMANCE STATUS (ECOG):  0 HEENT:  No visual changes, runny nose, sore throat, mouth sores or tenderness. Lungs: No shortness of breath or cough.  No hemoptysis. Cardiac:  No chest pain, palpitations, orthopnea, or PND. GI:  No abdominal pain.  No nausea, vomiting, diarrhea, constipation, melena or hematochezia. GU:  No urgency, frequency, dysuria, or hematuria. Musculoskeletal:  No back pain.  No joint pain.  No muscle tenderness. Extremities:  No pain or swelling. Skin:  No rashes, ulcers, or skin changes.  Neuro:  No headache, numbness or  weakness, balance or coordination issues. Endocrine:  No diabetes, thyroid issues, hot flashes or night sweats. Psych:  Anxious.  No mood changes or depression. Pain:  No focal pain. Review of systems:  All other systems reviewed and found to be negative.   Physical Exam: Blood pressure 126/78, pulse 76, temperature 97.6 F (36.4 C), temperature source Tympanic, resp. rate 20, weight 247 lb 2 oz (112.1 kg).  GENERAL:  Well developed, well nourished, muscular gentleman sitting comfortably in the exam room in no acute distress. MENTAL STATUS:  Alert and oriented to person, place and time. HEAD:  Alopecia.  Normocephalic, atraumatic, face symmetric, no Cushingoid  features. EYES:  Pupils equal round and reactive to light and accomodation.  No conjunctivitis or scleral icterus. ENT:  Oropharynx clear without lesion.  Tongue normal. Mucous membranes moist.  RESPIRATORY:  Clear to auscultation without rales, wheezes or rhonchi. CARDIOVASCULAR:  Regular rate and rhythm without murmur, rub or gallop. ABDOMEN:  Soft, non-tender, with active bowel sounds, and no hepatosplenomegaly. RECTAL:  Patient declines. SKIN:  No rashes or ulcers.  EXTREMITIES: No edema, no skin discoloration or tenderness.  No palpable cords. LYMPH NODES: No palpable cervical, supraclavicular, axillary or inguinal adenopathy  NEUROLOGICAL: Unremarkable. PSYCH:  Appropriate.   Appointment on 04/10/2017  Component Date Value Ref Range Status  . WBC 04/10/2017 5.2  3.8 - 10.6 K/uL Final  . RBC 04/10/2017 5.42  4.40 - 5.90 MIL/uL Final  . Hemoglobin 04/10/2017 16.7  13.0 - 18.0 g/dL Final   RESULT REPEATED AND VERIFIED  . HCT 04/10/2017 46.3  40.0 - 52.0 % Final   RESULT REPEATED AND VERIFIED  . MCV 04/10/2017 85.4  80.0 - 100.0 fL Final  . MCH 04/10/2017 30.8  26.0 - 34.0 pg Final  . MCHC 04/10/2017 36.1* 32.0 - 36.0 g/dL Final   RESULT REPEATED AND VERIFIED  . RDW 04/10/2017 13.0  11.5 - 14.5 % Final  . Platelets 04/10/2017 139* 150 - 440 K/uL Final  . Neutrophils Relative % 04/10/2017 64  % Final  . Neutro Abs 04/10/2017 3.3  1.4 - 6.5 K/uL Final  . Lymphocytes Relative 04/10/2017 29  % Final  . Lymphs Abs 04/10/2017 1.5  1.0 - 3.6 K/uL Final  . Monocytes Relative 04/10/2017 4  % Final  . Monocytes Absolute 04/10/2017 0.2  0.2 - 1.0 K/uL Final  . Eosinophils Relative 04/10/2017 3  % Final  . Eosinophils Absolute 04/10/2017 0.1  0 - 0.7 K/uL Final  . Basophils Relative 04/10/2017 0  % Final  . Basophils Absolute 04/10/2017 0.0  0 - 0.1 K/uL Final  . Sodium 04/10/2017 135  135 - 145 mmol/L Final  . Potassium 04/10/2017 3.7  3.5 - 5.1 mmol/L Final  . Chloride 04/10/2017 107   101 - 111 mmol/L Final  . CO2 04/10/2017 21* 22 - 32 mmol/L Final  . Glucose, Bld 04/10/2017 158* 65 - 99 mg/dL Final  . BUN 04/10/2017 21* 6 - 20 mg/dL Final  . Creatinine, Ser 04/10/2017 1.22  0.61 - 1.24 mg/dL Final  . Calcium 04/10/2017 9.6  8.9 - 10.3 mg/dL Final  . Total Protein 04/10/2017 8.0  6.5 - 8.1 g/dL Final  . Albumin 04/10/2017 4.5  3.5 - 5.0 g/dL Final  . AST 04/10/2017 27  15 - 41 U/L Final  . ALT 04/10/2017 26  17 - 63 U/L Final  . Alkaline Phosphatase 04/10/2017 76  38 - 126 U/L Final  . Total Bilirubin 04/10/2017 1.7* 0.3 - 1.2  mg/dL Final  . GFR calc non Af Amer 04/10/2017 >60  >60 mL/min Final  . GFR calc Af Amer 04/10/2017 >60  >60 mL/min Final   Comment: (NOTE) The eGFR has been calculated using the CKD EPI equation. This calculation has not been validated in all clinical situations. eGFR's persistently <60 mL/min signify possible Chronic Kidney Disease.   Georgiann Hahn gap 04/10/2017 7  5 - 15 Final   Assessment:  [facility] Labs Moman [name] is an 41 y.o. male gentleman with hairy cell leukemia.  He presented with symptomatic splenomegaly and mild leukopenia and thrombocytopenia.  He is 30 months s/p cladribine (09/30/2014 - 10/07/2014).  Counts are normal except for a slightly low platelet count.    He has a history of severe abdominal pain. Abdominal and pelvic CT scan on 11/30/2014 revealed chronic cholelithiasis. He underwent cholecystectomy on 12/15/2014.   Abdominal pain resolved after changing jobs.  He has a history of intermittent mild hyperbilirubinemia (1.1 - 1.9).  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  Symptomatically, he feels good.  Exam is unremarkable.  Platelet count is 139,000.  Bilirubin is 1.7 (direct 0.1).  Plan: 1.  Labs today:  CBC with diff, CMP, direct bilirubin. 2.  Discuss plans for follow-up after next visit in 6 months if patient feels good and platelet count is good. 3.  RTC in 4 months for MD assessment and  labs (CBC with diff,CMP).   Lequita Asal, MD  04/10/2017, 11:40 AM

## 2017-08-10 ENCOUNTER — Other Ambulatory Visit: Payer: Self-pay | Admitting: *Deleted

## 2017-08-10 DIAGNOSIS — C9141 Hairy cell leukemia, in remission: Secondary | ICD-10-CM

## 2017-08-13 ENCOUNTER — Other Ambulatory Visit: Payer: Self-pay | Admitting: Hematology and Oncology

## 2017-08-13 ENCOUNTER — Encounter: Payer: Self-pay | Admitting: Hematology and Oncology

## 2017-08-13 ENCOUNTER — Inpatient Hospital Stay: Payer: BC Managed Care – PPO

## 2017-08-13 ENCOUNTER — Other Ambulatory Visit: Payer: Self-pay | Admitting: *Deleted

## 2017-08-13 ENCOUNTER — Inpatient Hospital Stay: Payer: BC Managed Care – PPO | Attending: Hematology and Oncology | Admitting: Hematology and Oncology

## 2017-08-13 VITALS — BP 145/76 | HR 76 | Temp 97.8°F | Wt 257.2 lb

## 2017-08-13 DIAGNOSIS — R17 Unspecified jaundice: Secondary | ICD-10-CM

## 2017-08-13 DIAGNOSIS — Z79899 Other long term (current) drug therapy: Secondary | ICD-10-CM

## 2017-08-13 DIAGNOSIS — C9141 Hairy cell leukemia, in remission: Secondary | ICD-10-CM | POA: Diagnosis present

## 2017-08-13 DIAGNOSIS — Z9221 Personal history of antineoplastic chemotherapy: Secondary | ICD-10-CM

## 2017-08-13 LAB — COMPREHENSIVE METABOLIC PANEL
ALT: 30 U/L (ref 17–63)
AST: 28 U/L (ref 15–41)
Albumin: 4.6 g/dL (ref 3.5–5.0)
Alkaline Phosphatase: 81 U/L (ref 38–126)
Anion gap: 8 (ref 5–15)
BUN: 23 mg/dL — ABNORMAL HIGH (ref 6–20)
CO2: 23 mmol/L (ref 22–32)
Calcium: 9.6 mg/dL (ref 8.9–10.3)
Chloride: 107 mmol/L (ref 101–111)
Creatinine, Ser: 1.35 mg/dL — ABNORMAL HIGH (ref 0.61–1.24)
GFR calc Af Amer: >60 mL/min (ref >60)
GFR calc non Af Amer: >60 mL/min (ref >60)
Glucose, Bld: 90 mg/dL (ref 65–99)
Potassium: 3.8 mmol/L (ref 3.5–5.1)
Sodium: 138 mmol/L (ref 135–145)
Total Bilirubin: 1.4 mg/dL — ABNORMAL HIGH (ref 0.3–1.2)
Total Protein: 8.2 g/dL — ABNORMAL HIGH (ref 6.5–8.1)

## 2017-08-13 LAB — CBC WITH DIFFERENTIAL/PLATELET
Basophils Absolute: 0.1 10*3/uL (ref 0–0.1)
Basophils Relative: 1 %
Eosinophils Absolute: 0.1 10*3/uL (ref 0–0.7)
Eosinophils Relative: 2 %
HCT: 47.7 % (ref 40.0–52.0)
Hemoglobin: 16.7 g/dL (ref 13.0–18.0)
Lymphocytes Relative: 24 %
Lymphs Abs: 1.3 10*3/uL (ref 1.0–3.6)
MCH: 30.1 pg (ref 26.0–34.0)
MCHC: 35 g/dL (ref 32.0–36.0)
MCV: 86 fL (ref 80.0–100.0)
Monocytes Absolute: 0.4 10*3/uL (ref 0.2–1.0)
Monocytes Relative: 7 %
Neutro Abs: 3.6 10*3/uL (ref 1.4–6.5)
Neutrophils Relative %: 66 %
Platelets: 136 10*3/uL — ABNORMAL LOW (ref 150–440)
RBC: 5.55 MIL/uL (ref 4.40–5.90)
RDW: 12.9 % (ref 11.5–14.5)
WBC: 5.4 10*3/uL (ref 3.8–10.6)

## 2017-08-13 LAB — BILIRUBIN, DIRECT: Bilirubin, Direct: 0.2 mg/dL (ref 0.1–0.5)

## 2017-08-13 NOTE — Progress Notes (Signed)
Pt in today for follow up, denies any concern or difficulties.  States "feels great".

## 2017-08-13 NOTE — Progress Notes (Signed)
[facility] day:  08/13/17  Chief Complaint: Kenneth Friedman. is an 41 y.o. 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  HPI: The patient was last seen in the medical oncology clinic on 04/10/2017. At that time, he felt good.  Exam was unremarkable.  Platelet count was 139,000.  Bilirubin was 1.7 (direct 0.1).   During the interim, patient is doing well. He denies physical complaints. Patient continues to work out, mainly [employer], 4 days a week. Patient has some "restless sleep" issues. He denies increased stress. Patient is eating well, with no demonstrated weight loss.  He denies any fevers or infections.   Past Medical History:  Diagnosis Date  . Allergy    seasonal  . Hairy cell leukemia ([place])   . Hairy cell leukemia ([place]) 02/09/2015    Past Surgical History:  Procedure Laterality Date  . CHOLECYSTECTOMY  12/15/14  . HERNIA REPAIR  2011  . INGUINAL HERNIA REPAIR    . ROTATOR CUFF REPAIR Right 2009, 2010    Family History  Problem Relation Age of Onset  . Hypertension Mother   . Hypertension Father   . Cancer Paternal Aunt   . Cancer Paternal Grandmother   . Cancer Paternal Grandfather     Social History:  reports that  has never smoked. he has never used smokeless tobacco. He reports that he drinks alcohol. He reports that he does not use drugs.   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  He works out at [employer] regularly.  The patient is alone today.  Allergies:  Allergies  Allergen Reactions  . Fentanyl Other (See Comments)    Pt states " knocks me out for days, I cant handle it at all"  . Diclofenac Other (See Comments)    GI DISTRESS   Current Medications: Current Outpatient Medications  Medication Sig Dispense  Refill  . Creatine 5000 MG PACK Take 5,000 mg by mouth.    . hydrocortisone (PROCTOSOL HC) 2.5 % rectal cream APPLY TOPICALLY TWICE DAILY AS NEEDED FOR HEMORRHOIDS    . ibuprofen (ADVIL,MOTRIN) 200 MG tablet Take 200 mg by mouth every 6 (six) hours as needed.    . Multiple Vitamin (MULTIVITAMIN) tablet Take 1 tablet by mouth daily.    . Omega-3 Fatty Acids (FISH OIL) 500 MG CAPS Take 500 capsules by mouth.     No current facility-administered medications for this visit.    Review of Systems:  GENERAL:  Feels good.  No fevers or sweats.  Weight up 10 pounds. PERFORMANCE STATUS (ECOG):  0 HEENT:  No visual changes, runny nose, sore throat, mouth sores or tenderness. Lungs: No shortness of breath or cough.  No hemoptysis. Cardiac:  No chest pain, palpitations, orthopnea, or PND. GI:  No abdominal pain.  No nausea, vomiting, diarrhea, constipation, melena or hematochezia. GU:  No urgency, frequency, dysuria, or hematuria. Musculoskeletal:  No back pain.  No joint pain.  No muscle tenderness. Extremities:  No pain or swelling. Skin:  No rashes, ulcers, or skin changes.  Neuro:  No headache, numbness or weakness, balance or coordination issues. Endocrine:  No diabetes, thyroid issues, hot flashes or night sweats. Psych:  Anxious, less.  No mood changes or depression. Pain:  No focal pain. Review of systems:  All other systems reviewed and found to be negative.   Physical Exam: Blood pressure (!) 145/76, pulse 76, temperature 97.8 F (36.6 C), temperature source Tympanic, weight 257 lb 4 oz (116.7 kg).  GENERAL:  Well developed, well nourished, muscular gentleman sitting comfortably in the exam room in no acute distress. MENTAL STATUS:  Alert and oriented to person, place and time. HEAD:  Alopecia.  Graying goatee.  Normocephalic, atraumatic, face symmetric, no Cushingoid features. EYES:  Pupils equal round and reactive to light and accomodation.  No conjunctivitis or scleral icterus. ENT:   Oropharynx clear without lesion.  Tongue normal. Mucous membranes moist.  RESPIRATORY:  Clear to auscultation without rales, wheezes or rhonchi. CARDIOVASCULAR:  Regular rate and rhythm without murmur, rub or gallop. ABDOMEN:  Soft, non-tender, with active bowel sounds, and no hepatosplenomegaly. SKIN:  No rashes or ulcers.  EXTREMITIES: No edema, no skin discoloration or tenderness.  No palpable cords. LYMPH NODES: No palpable cervical, supraclavicular, axillary or inguinal adenopathy  NEUROLOGICAL: Unremarkable. PSYCH:  Appropriate.   Appointment on 08/13/2017  Component Date Value Ref Range Status  . Bilirubin, Direct 08/13/2017 0.2  0.1 - 0.5 mg/dL Final  . Sodium 08/13/2017 138  135 - 145 mmol/L Final  . Potassium 08/13/2017 3.8  3.5 - 5.1 mmol/L Final  . Chloride 08/13/2017 107  101 - 111 mmol/L Final  . CO2 08/13/2017 23  22 - 32 mmol/L Final  . Glucose, Bld 08/13/2017 90  65 - 99 mg/dL Final  . BUN 08/13/2017 23* 6 - 20 mg/dL Final  . Creatinine, Ser 08/13/2017 1.35* 0.61 - 1.24 mg/dL Final  . Calcium 08/13/2017 9.6  8.9 - 10.3 mg/dL Final  . Total Protein 08/13/2017 8.2* 6.5 - 8.1 g/dL Final  . Albumin 08/13/2017 4.6  3.5 - 5.0 g/dL Final  . AST 08/13/2017 28  15 - 41 U/L Final  . ALT 08/13/2017 30  17 - 63 U/L Final  . Alkaline Phosphatase 08/13/2017 81  38 - 126 U/L Final  . Total Bilirubin 08/13/2017 1.4* 0.3 - 1.2 mg/dL Final  . GFR calc non Af Amer 08/13/2017 >60  >60 mL/min Final  . GFR calc Af Amer 08/13/2017 >60  >60 mL/min Final   Comment: (NOTE) The eGFR has been calculated using the CKD EPI equation. This calculation has not been validated in all clinical situations. eGFR's persistently <60 mL/min signify possible Chronic Kidney Disease.   . Anion gap 08/13/2017 8  5 - 15 Final  . WBC 08/13/2017 5.4  3.8 - 10.6 K/uL Final  . RBC 08/13/2017 5.55  4.40 - 5.90 MIL/uL Final  . Hemoglobin 08/13/2017 16.7  13.0 - 18.0 g/dL Final  . HCT 08/13/2017 47.7  40.0 -  52.0 % Final  . MCV 08/13/2017 86.0  80.0 - 100.0 fL Final  . MCH 08/13/2017 30.1  26.0 - 34.0 pg Final  . MCHC 08/13/2017 35.0  32.0 - 36.0 g/dL Final  . RDW 08/13/2017 12.9  11.5 - 14.5 % Final  . Platelets 08/13/2017 136* 150 - 440 K/uL Final  . Neutrophils Relative % 08/13/2017 66  % Final  . Neutro Abs 08/13/2017 3.6  1.4 - 6.5 K/uL Final  . Lymphocytes Relative 08/13/2017 24  % Final  . Lymphs Abs 08/13/2017 1.3  1.0 - 3.6 K/uL Final  . Monocytes Relative 08/13/2017 7  % Final  . Monocytes Absolute 08/13/2017 0.4  0.2 - 1.0 K/uL Final  . Eosinophils Relative 08/13/2017  2  % Final  . Eosinophils Absolute 08/13/2017 0.1  0 - 0.7 K/uL Final  . Basophils Relative 08/13/2017 1  % Final  . Basophils Absolute 08/13/2017 0.1  0 - 0.1 K/uL Final   Assessment:  Kenneth Charters. is an 41 y.o. male gentleman with hairy cell leukemia.  He presented with symptomatic splenomegaly and mild leukopenia and thrombocytopenia.  He is 30 months s/p cladribine (09/30/2014 - 10/07/2014).  Counts are normal except for a slightly low platelet count.    He has a history of severe abdominal pain. Abdominal and pelvic CT scan on 11/30/2014 revealed chronic cholelithiasis. He underwent cholecystectomy on 12/15/2014.   Abdominal pain resolved after changing jobs.  He has a history of intermittent mild hyperbilirubinemia (1.1 - 1.9).  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  Symptomatically, he feels good.  Exam is unremarkable.  WBC is 5400 with an [place] of 3600.  Platelet count is 136,000.    Plan: 1.  Labs today:  CBC with diff, CMP, direct bilirubin. 2.  RTC in 6 months for MD assessment and labs (CBC with diff, CMP).   Lequita Asal, MD  08/13/2017, 11:58 AM   I saw and evaluated the patient, participating in the key portions of the service and reviewing pertinent diagnostic studies and records.  I reviewed the nurse practitioner's note and agree with the findings  and the plan.  The assessment and plan were discussed with the patient.  A few questions were asked by the patient and answered.   Nolon Stalls, MD 08/13/2017,11:58 AM

## 2018-02-11 ENCOUNTER — Inpatient Hospital Stay: Payer: BC Managed Care – PPO

## 2018-02-11 ENCOUNTER — Ambulatory Visit: Payer: BC Managed Care – PPO | Admitting: Hematology and Oncology

## 2018-02-11 ENCOUNTER — Inpatient Hospital Stay: Payer: BC Managed Care – PPO | Admitting: Hematology and Oncology

## 2018-02-11 ENCOUNTER — Other Ambulatory Visit: Payer: BC Managed Care – PPO

## 2018-02-22 ENCOUNTER — Other Ambulatory Visit: Payer: Self-pay | Admitting: Hematology and Oncology

## 2018-02-22 ENCOUNTER — Inpatient Hospital Stay (HOSPITAL_BASED_OUTPATIENT_CLINIC_OR_DEPARTMENT_OTHER): Payer: BC Managed Care – PPO | Admitting: Hematology and Oncology

## 2018-02-22 ENCOUNTER — Encounter: Payer: Self-pay | Admitting: Hematology and Oncology

## 2018-02-22 ENCOUNTER — Inpatient Hospital Stay: Payer: BC Managed Care – PPO | Attending: Hematology and Oncology

## 2018-02-22 VITALS — BP 120/79 | HR 56 | Temp 97.7°F | Resp 18 | Ht 73.0 in | Wt 255.0 lb

## 2018-02-22 DIAGNOSIS — C9140 Hairy cell leukemia not having achieved remission: Secondary | ICD-10-CM

## 2018-02-22 DIAGNOSIS — C9141 Hairy cell leukemia, in remission: Secondary | ICD-10-CM

## 2018-02-22 DIAGNOSIS — R17 Unspecified jaundice: Secondary | ICD-10-CM

## 2018-02-22 LAB — CBC WITH DIFFERENTIAL/PLATELET
Basophils Absolute: 0 10*3/uL (ref 0–0.1)
Basophils Relative: 1 %
Eosinophils Absolute: 0.1 10*3/uL (ref 0–0.7)
Eosinophils Relative: 2 %
HCT: 45.5 % (ref 40.0–52.0)
Hemoglobin: 16.2 g/dL (ref 13.0–18.0)
Lymphocytes Relative: 28 %
Lymphs Abs: 1.5 10*3/uL (ref 1.0–3.6)
MCH: 30.9 pg (ref 26.0–34.0)
MCHC: 35.6 g/dL (ref 32.0–36.0)
MCV: 86.9 fL (ref 80.0–100.0)
Monocytes Absolute: 0.4 10*3/uL (ref 0.2–1.0)
Monocytes Relative: 7 %
Neutro Abs: 3.2 10*3/uL (ref 1.4–6.5)
Neutrophils Relative %: 62 %
Platelets: 133 10*3/uL — ABNORMAL LOW (ref 150–440)
RBC: 5.24 MIL/uL (ref 4.40–5.90)
RDW: 13.3 % (ref 11.5–14.5)
WBC: 5.1 10*3/uL (ref 3.8–10.6)

## 2018-02-22 LAB — COMPREHENSIVE METABOLIC PANEL
ALT: 22 U/L (ref 17–63)
AST: 21 U/L (ref 15–41)
Albumin: 4.4 g/dL (ref 3.5–5.0)
Alkaline Phosphatase: 65 U/L (ref 38–126)
Anion gap: 8 (ref 5–15)
BUN: 26 mg/dL — ABNORMAL HIGH (ref 6–20)
CO2: 23 mmol/L (ref 22–32)
Calcium: 9.5 mg/dL (ref 8.9–10.3)
Chloride: 107 mmol/L (ref 101–111)
Creatinine, Ser: 1.54 mg/dL — ABNORMAL HIGH (ref 0.61–1.24)
GFR calc Af Amer: >60 mL/min (ref >60)
GFR calc non Af Amer: 54 mL/min — ABNORMAL LOW (ref >60)
Glucose, Bld: 94 mg/dL (ref 65–99)
Potassium: 4 mmol/L (ref 3.5–5.1)
Sodium: 138 mmol/L (ref 135–145)
Total Bilirubin: 1.2 mg/dL (ref 0.3–1.2)
Total Protein: 7.9 g/dL (ref 6.5–8.1)

## 2018-02-22 LAB — BILIRUBIN, DIRECT: Bilirubin, Direct: 0.1 mg/dL (ref 0.1–0.5)

## 2018-02-22 NOTE — Progress Notes (Signed)
[facility] day:  02/22/18  Chief Complaint: Waldron Labs Noyes [name] 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  HPI: The patient was last seen in the medical oncology clinic on 08/13/2017. At that time, he felt good.  Exam was unremarkable.  WBC was 5400 with an [place] of 3600.  Platelet count was 136,000.    During the interim, patient is doing well today. There are no acute concerns. Patient denies B symptoms and interval infections. Patient is eating well. Patient is intentionally trying to lose weight. He is doing cardio and eating clean. He was concerned with his weight gain. Goal is around 250 pounds. Patient notes that he feels "good".   Patient denies pain in the clinic today.    Past Medical History:  Diagnosis Date  . Allergy    seasonal  . Hairy cell leukemia ([place])   . Hairy cell leukemia ([place]) 02/09/2015    Past Surgical History:  Procedure Laterality Date  . CHOLECYSTECTOMY  12/15/14  . HERNIA REPAIR  2011  . INGUINAL HERNIA REPAIR    . ROTATOR CUFF REPAIR Right 2009, 2010    Family History  Problem Relation Age of Onset  . Hypertension Mother   . Hypertension Father   . Cancer Paternal Aunt   . Cancer Paternal Grandmother   . Cancer Paternal Grandfather     Social History:  reports that he has never smoked. He has never used smokeless tobacco. He reports that he drinks alcohol. He reports that he does not use drugs.   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  He works out at [employer] regularly.  The patient is alone today.  Allergies:  Allergies  Allergen Reactions  . Fentanyl Other (See Comments)    Pt states " knocks me out for days, I cant handle it at all"  . Diclofenac Other (See Comments)    GI DISTRESS   Current  Medications: Current Outpatient Medications  Medication Sig Dispense Refill  . Creatine 5000 MG PACK Take 5,000 mg by mouth.    . Multiple Vitamin (MULTIVITAMIN) tablet Take 1 tablet by mouth daily.    . Omega-3 Fatty Acids (FISH OIL) 500 MG CAPS Take 500 capsules by mouth.    . hydrocortisone (PROCTOSOL HC) 2.5 % rectal cream APPLY TOPICALLY TWICE DAILY AS NEEDED FOR HEMORRHOIDS    . ibuprofen (ADVIL,MOTRIN) 200 MG tablet Take 200 mg by mouth every 6 (six) hours as needed.     No current facility-administered medications for this visit.     Review of Systems  Constitutional: Negative for diaphoresis, fever, malaise/fatigue and weight loss.       "I feel good".  HENT: Negative.   Eyes: Negative.   Respiratory: Negative for cough, hemoptysis, sputum production and shortness of breath.   Cardiovascular: Negative for chest pain, palpitations, orthopnea, leg swelling and PND.       Bradycardic at 55  Gastrointestinal: Negative for abdominal pain, blood in stool, constipation, diarrhea, melena, nausea and vomiting.  Genitourinary: Negative for dysuria, frequency, hematuria and urgency.  Musculoskeletal: Negative for back pain, falls, joint pain and myalgias.  Skin: Negative for itching and rash.  Neurological: Negative for dizziness, tremors, weakness and headaches.  Endo/Heme/Allergies: Does  not bruise/bleed easily.  Psychiatric/Behavioral: Negative for depression, memory loss and suicidal ideas. The patient is not nervous/anxious and does not have insomnia.   All other systems reviewed and are negative.  Performance status (ECOG): 0 - Asymptomatic   Physical Exam: Blood pressure 120/79, pulse (!) 56, temperature 97.7 F (36.5 C), resp. rate 18, height '6\' 1"'$  (1.854 m), weight 255 lb (115.7 kg), SpO2 98 %.  GENERAL:  Well developed, well nourished, muscular gentleman sitting comfortably in the exam room in no acute distress. MENTAL STATUS:  Alert and oriented to person, place and  time. HEAD:  Alopecia.  Dark eyebrows.  Scruffy beard.  Normocephalic, atraumatic, face symmetric, no Cushingoid features. EYES:  Pupils equal round and reactive to light and accomodation.  No conjunctivitis or scleral icterus. ENT:  Oropharynx clear without lesion.  Tongue normal. Mucous membranes moist.  RESPIRATORY:  Clear to auscultation without rales, wheezes or rhonchi. CARDIOVASCULAR:  Regular rate and rhythm without murmur, rub or gallop. ABDOMEN:  Soft, non-tender, with active bowel sounds, and no hepatosplenomegaly.  No masses. SKIN:  No rashes, ulcers or lesions. EXTREMITIES: No edema, no skin discoloration or tenderness.  No palpable cords. LYMPH NODES: No palpable cervical, supraclavicular, axillary or inguinal adenopathy  NEUROLOGICAL: Unremarkable. PSYCH:  Appropriate.    Appointment on 02/22/2018  Component Date Value Ref Range Status  . Bilirubin, Direct 02/22/2018 0.1  0.1 - 0.5 mg/dL Final   Performed at [facility], [address] [place], [place] [address]  . Sodium 02/22/2018 138  135 - 145 mmol/L Final  . Potassium 02/22/2018 4.0  3.5 - 5.1 mmol/L Final  . Chloride 02/22/2018 107  101 - 111 mmol/L Final  . CO2 02/22/2018 23  22 - 32 mmol/L Final  . Glucose, Bld 02/22/2018 94  65 - 99 mg/dL Final  . BUN 02/22/2018 26* 6 - 20 mg/dL Final  . Creatinine, Ser 02/22/2018 1.54* 0.61 - 1.24 mg/dL Final  . Calcium 02/22/2018 9.5  8.9 - 10.3 mg/dL Final  . Total Protein 02/22/2018 7.9  6.5 - 8.1 g/dL Final  . Albumin 02/22/2018 4.4  3.5 - 5.0 g/dL Final  . AST 02/22/2018 21  15 - 41 U/L Final  . ALT 02/22/2018 22  17 - 63 U/L Final  . Alkaline Phosphatase 02/22/2018 65  38 - 126 U/L Final  . Total Bilirubin 02/22/2018 1.2  0.3 - 1.2 mg/dL Final  . GFR calc non Af Amer 02/22/2018 54* >60 mL/min Final  . GFR calc Af Amer 02/22/2018 >60  >60 mL/min Final   Comment: (NOTE) The eGFR has been calculated using the CKD EPI equation. This calculation has not been  validated in all clinical situations. eGFR's persistently <60 mL/min signify possible Chronic Kidney Disease.   Georgiann Hahn gap 02/22/2018 8  5 - 15 Final   Performed at [facility], [address] [place], [place] [address]  . WBC 02/22/2018 5.1  3.8 - 10.6 K/uL Final  . RBC 02/22/2018 5.24  4.40 - 5.90 MIL/uL Final  . Hemoglobin 02/22/2018 16.2  13.0 - 18.0 g/dL Final   RESULT REPEATED AND VERIFIED  . HCT 02/22/2018 45.5  40.0 - 52.0 % Final   RESULT REPEATED AND VERIFIED  . MCV 02/22/2018 86.9  80.0 - 100.0 fL Final  . MCH 02/22/2018 30.9  26.0 - 34.0 pg Final  . MCHC 02/22/2018 35.6  32.0 - 36.0 g/dL Final  . RDW 02/22/2018 13.3  11.5 - 14.5 % Final  .  Platelets 02/22/2018 133* 150 - 440 K/uL Final  . Neutrophils Relative % 02/22/2018 62  % Final  . Neutro Abs 02/22/2018 3.2  1.4 - 6.5 K/uL Final  . Lymphocytes Relative 02/22/2018 28  % Final  . Lymphs Abs 02/22/2018 1.5  1.0 - 3.6 K/uL Final  . Monocytes Relative 02/22/2018 7  % Final  . Monocytes Absolute 02/22/2018 0.4  0.2 - 1.0 K/uL Final  . Eosinophils Relative 02/22/2018 2  % Final  . Eosinophils Absolute 02/22/2018 0.1  0 - 0.7 K/uL Final  . Basophils Relative 02/22/2018 1  % Final  . Basophils Absolute 02/22/2018 0.0  0 - 0.1 K/uL Final   Performed at [facility], [address], [place], [place] [address]   Assessment:  Zaylyn Bergdoll. is an 42 y.o. male gentleman with hairy cell leukemia.  He presented with symptomatic splenomegaly and mild leukopenia and thrombocytopenia.  He is 40 months s/p cladribine (09/30/2014 - 10/07/2014).  Counts are normal except for a slightly low platelet count.    He has a history of severe abdominal pain. Abdominal and pelvic CT scan on 11/30/2014 revealed chronic cholelithiasis. He underwent cholecystectomy on 12/15/2014.   Abdominal pain resolved after changing jobs.  He has a history of intermittent mild hyperbilirubinemia (1.1 - 1.9).  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  Symptomatically, he feels good.  Patient has no B symptoms or interval infections.  Exam is normal.  WBC is 5100 ([place] 3200).  Platelet count is 133,000.  Creatinine is 1.54.   Plan: 1.  Labs today:  CBC with diff, CMP, direct bilirubin. 2.  RTC in 6 months for MD assessment and labs (CBC with diff, CMP).   Honor Loh, NP  02/22/2018, 12:13 PM   I saw and evaluated the patient, participating in the key portions of the service and reviewing pertinent diagnostic studies and records.  I reviewed the nurse practitioner's note and agree with the findings and the plan.  The assessment and plan were discussed with the patient.  A few questions were asked by the patient and answered.   Nolon Stalls, MD 02/22/2018,12:13 PM

## 2018-02-22 NOTE — Progress Notes (Signed)
New pain noted to upper chest bilateral  X 2-3 weeks

## 2018-03-11 ENCOUNTER — Other Ambulatory Visit: Payer: Self-pay | Admitting: Sports Medicine

## 2018-03-11 DIAGNOSIS — M545 Low back pain: Secondary | ICD-10-CM

## 2018-03-14 ENCOUNTER — Ambulatory Visit
Admission: RE | Admit: 2018-03-14 | Discharge: 2018-03-14 | Disposition: A | Discharge status: Home / Self Care | Payer: BC Managed Care – PPO | Source: Ambulatory Visit | Attending: Sports Medicine | Admitting: Sports Medicine

## 2018-03-14 DIAGNOSIS — M545 Low back pain: Secondary | ICD-10-CM

## 2018-07-08 ENCOUNTER — Telehealth: Payer: Self-pay | Admitting: *Deleted

## 2018-07-08 NOTE — Telephone Encounter (Signed)
Patient called stating he does not have follow up until December and is having problems, unintentional 10# wt loss in 2 weeks, generally not feeling well. Please advise

## 2018-07-08 NOTE — Telephone Encounter (Signed)
We would certainly be glad to see him. Wednesday in [place] would be ideal, but if not, Friday would also be good.

## 2018-07-10 ENCOUNTER — Other Ambulatory Visit: Payer: Self-pay | Admitting: Hematology and Oncology

## 2018-07-10 ENCOUNTER — Inpatient Hospital Stay: Payer: BC Managed Care – PPO

## 2018-07-10 ENCOUNTER — Inpatient Hospital Stay: Payer: BC Managed Care – PPO | Attending: Hematology and Oncology | Admitting: Hematology and Oncology

## 2018-07-10 ENCOUNTER — Encounter: Payer: Self-pay | Admitting: Hematology and Oncology

## 2018-07-10 VITALS — BP 140/90 | HR 66 | Resp 18 | Wt 249.6 lb

## 2018-07-10 DIAGNOSIS — R634 Abnormal weight loss: Secondary | ICD-10-CM | POA: Diagnosis not present

## 2018-07-10 DIAGNOSIS — R17 Unspecified jaundice: Secondary | ICD-10-CM

## 2018-07-10 DIAGNOSIS — Z9221 Personal history of antineoplastic chemotherapy: Secondary | ICD-10-CM | POA: Diagnosis not present

## 2018-07-10 DIAGNOSIS — R0602 Shortness of breath: Secondary | ICD-10-CM | POA: Diagnosis not present

## 2018-07-10 DIAGNOSIS — C9141 Hairy cell leukemia, in remission: Secondary | ICD-10-CM

## 2018-07-10 DIAGNOSIS — R109 Unspecified abdominal pain: Secondary | ICD-10-CM | POA: Diagnosis not present

## 2018-07-10 LAB — COMPREHENSIVE METABOLIC PANEL
ALT: 28 U/L (ref 0–44)
AST: 21 U/L (ref 15–41)
Albumin: 4.4 g/dL (ref 3.5–5.0)
Alkaline Phosphatase: 66 U/L (ref 38–126)
Anion gap: 8 (ref 5–15)
BUN: 19 mg/dL (ref 6–20)
CO2: 25 mmol/L (ref 22–32)
Calcium: 9.4 mg/dL (ref 8.9–10.3)
Chloride: 101 mmol/L (ref 98–111)
Creatinine, Ser: 1.23 mg/dL (ref 0.61–1.24)
GFR calc Af Amer: >60 mL/min (ref >60)
GFR calc non Af Amer: >60 mL/min (ref >60)
Glucose, Bld: 91 mg/dL (ref 70–99)
Potassium: 3.8 mmol/L (ref 3.5–5.1)
Sodium: 134 mmol/L — ABNORMAL LOW (ref 135–145)
Total Bilirubin: 1.5 mg/dL — ABNORMAL HIGH (ref 0.3–1.2)
Total Protein: 7.9 g/dL (ref 6.5–8.1)

## 2018-07-10 LAB — CBC WITH DIFFERENTIAL/PLATELET
Abs Immature Granulocytes: 0.01 10*3/uL (ref 0.00–0.07)
Basophils Absolute: 0 10*3/uL (ref 0.0–0.1)
Basophils Relative: 1 %
Eosinophils Absolute: 0.2 10*3/uL (ref 0.0–0.5)
Eosinophils Relative: 3 %
HCT: 46.7 % (ref 39.0–52.0)
Hemoglobin: 16.4 g/dL (ref 13.0–17.0)
Immature Granulocytes: 0 %
Lymphocytes Relative: 28 %
Lymphs Abs: 1.8 10*3/uL (ref 0.7–4.0)
MCH: 30.5 pg (ref 26.0–34.0)
MCHC: 35.1 g/dL (ref 30.0–36.0)
MCV: 87 fL (ref 80.0–100.0)
Monocytes Absolute: 0.4 10*3/uL (ref 0.1–1.0)
Monocytes Relative: 7 %
Neutro Abs: 4 10*3/uL (ref 1.7–7.7)
Neutrophils Relative %: 61 %
Platelets: 136 10*3/uL — ABNORMAL LOW (ref 150–400)
RBC: 5.37 MIL/uL (ref 4.22–5.81)
RDW: 12.9 % (ref 11.5–15.5)
WBC: 6.5 10*3/uL (ref 4.0–10.5)
nRBC: 0 % (ref 0.0–0.2)

## 2018-07-10 LAB — BILIRUBIN, DIRECT: Bilirubin, Direct: 0.1 mg/dL (ref 0.0–0.2)

## 2018-07-10 LAB — TSH: TSH: 0.767 u[IU]/mL (ref 0.350–4.500)

## 2018-07-10 NOTE — Progress Notes (Signed)
Patient states he has a fullness in his left abdomen.  Also states on Friday he was backing out of his driveway and reached into the floorboard for something and suddenly realized he was half in the street.  Unsure if he passed out or not.  Felt a little SOB but went on his way.  Patient also states he feels lethargic.  He is eating well, however has lost from 263 to 255 in may and today weighs 249.  He states he has been under a lot of stress.  Patient is accompanied by his wife today.

## 2018-07-10 NOTE — Progress Notes (Signed)
[facility] day:  07/10/18  Chief Complaint: Kenneth Friedman. is an 42 y.o. 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  HPI: The patient was last seen in the medical oncology clinic on 02/22/2018.  At that time, he felt good.  He denied any B symptoms or interval infections.  Exam was normal.  WBC was 5100 ([place] 3200).  Platelet count was 133,000.  Creatinine was 1.54. He was scheduled for 6 month follow-up.  He called the clinic with concerns of a 10 pound unintentional weight loss and not feeling good.  During the interim, patient notes that he has not been feeling well as of late. He has been having a sensation of abdominal fullness and intermittent periods of shortness of breath. Patient describes an episode of what may have been a syncopal episode. He states, "I was backing out of the driveway and bent over to pick something up out of the floor board.  The next thing that I know my car was halfway in the street.  I am not sure if I passed out or not".    Patient also notes a substantial weight loss.  Despite documented weights here at the clinic that demonstrate a near 6 pound loss, patient notes that he "gained a lot of weight over the summer".  Patient advising that he had gotten up to a weight of 263 pounds over the summer, and today he is at 249 pounds, which represents an unintentional 14 pound loss.  Patient has been under a lot of stress.  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 Additionally, he recently participated in a charity dancing competition which has required him to be more physically active over the last few months as he prepared for the event.   Patient denies any fevers, sweats, or changes to her bowel habits.  He has not experienced any nausea, vomiting, or diarrhea.  Other than the abdominal fullness, patient has no abdominal  complaints today.  He denies recent illnesses.  Patient is fatigued.  Patient denies pain in the clinic today.   Past Medical History:  Diagnosis Date  . Allergy    seasonal  . Hairy cell leukemia ([place])   . Hairy cell leukemia ([place]) 02/09/2015    Past Surgical History:  Procedure Laterality Date  . CHOLECYSTECTOMY  12/15/14  . HERNIA REPAIR  2011  . INGUINAL HERNIA REPAIR    . ROTATOR CUFF REPAIR Right 2009, 2010    Family History  Problem Relation Age of Onset  . Hypertension Mother   . Hypertension Father   . Cancer Paternal Aunt   . Cancer Paternal Grandmother   . Cancer Paternal Grandfather     Social History:  reports that he has never smoked. He has never used smokeless tobacco. He reports that he drinks alcohol. He reports that he does not use drugs.   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  He works out at [employer] regularly.  The patient is alone today.  Allergies:  Allergies  Allergen Reactions  . Fentanyl Other (See Comments)    Pt states " knocks me out for days, I cant handle it at all"  . Diclofenac  Other (See Comments)    GI DISTRESS   Current Medications: Current Outpatient Medications  Medication Sig Dispense Refill  . hydrocortisone (PROCTOSOL HC) 2.5 % rectal cream APPLY TOPICALLY TWICE DAILY AS NEEDED FOR HEMORRHOIDS    . ibuprofen (ADVIL,MOTRIN) 200 MG tablet Take 200 mg by mouth every 6 (six) hours as needed.    . Multiple Vitamin (MULTIVITAMIN) tablet Take 1 tablet by mouth daily.    . Omega-3 Fatty Acids (FISH OIL) 500 MG CAPS Take 500 capsules by mouth.     No current facility-administered medications for this visit.     Review of Systems  Constitutional: Positive for malaise/fatigue and weight loss (6 pounds by documentation; 14 pound by patient report). Negative for diaphoresis and fever.       "I have not been  feeling well"  HENT: Negative.   Eyes: Negative.   Respiratory: Positive for shortness of breath (intermittent episodes). Negative for cough, hemoptysis and sputum production.   Cardiovascular: Negative for chest pain, palpitations, orthopnea, leg swelling and PND.  Gastrointestinal: Positive for abdominal pain (fullness sensation). Negative for blood in stool, constipation, diarrhea, melena, nausea and vomiting.  Genitourinary: Negative for dysuria, frequency, hematuria and urgency.  Musculoskeletal: Negative for back pain, falls, joint pain and myalgias.  Skin: Negative for itching and rash.  Neurological: Negative for dizziness, tremors, weakness and headaches.       ?? syncopal episode x 1 precipitated by bending at the waist while in a seated position.   Endo/Heme/Allergies: Does not bruise/bleed easily.  Psychiatric/Behavioral: Negative for depression, memory loss and suicidal ideas. The patient is not nervous/anxious and does not have insomnia.        Increased stress  All other systems reviewed and are negative.  Performance status (ECOG): 0 - Asymptomatic  Vital Signs BP 140/90 (BP Location: Left Arm, Patient Position: Sitting)   Pulse 66   Resp 18   Wt 249 lb 9 oz (113.2 kg)   BMI 32.93 kg/m   Physical Exam  Constitutional: He is oriented to person, place, and time and well-developed, well-nourished, and in no distress. No distress.  HENT:  Head: Normocephalic and atraumatic.  Mouth/Throat: Oropharynx is clear and moist and mucous membranes are normal. No oropharyngeal exudate.  Bald. Dark eyebrows.  Short beard.  Eyes: Pupils are equal, round, and reactive to light. Conjunctivae and EOM are normal. No scleral icterus.  Brown eyes.   Neck: Normal range of motion. Neck supple. No JVD present.  Cardiovascular: Normal rate, regular rhythm, normal heart sounds and intact distal pulses. Exam reveals no gallop and no friction rub.  No murmur heard. Pulmonary/Chest: Effort  normal and breath sounds normal. No respiratory distress. He has no wheezes. He has no rales.  Abdominal: Soft. Bowel sounds are normal. He exhibits no distension and no mass. There is no abdominal tenderness. There is no rebound and no guarding.  Musculoskeletal: Normal range of motion. He exhibits no edema or tenderness.  Lymphadenopathy:    He has no cervical adenopathy.    He has no axillary adenopathy.       Right: No inguinal and no supraclavicular adenopathy present.       Left: No inguinal and no supraclavicular adenopathy present.  Neurological: He is alert and oriented to person, place, and time.  Skin: Skin is warm and dry. No rash noted. He is not diaphoretic. No erythema.  Psychiatric: Mood, affect and judgment normal.  Nursing note and vitals reviewed.   Appointment on 07/10/2018  Component Date Value Ref Range Status  . Bilirubin, Direct 07/10/2018 0.1  0.0 - 0.2 mg/dL Final   Performed at [facility], [address], [place], [place] [address]  . TSH 07/10/2018 0.767  0.350 - 4.500 uIU/mL Final   Comment: Performed by a 3rd Generation assay with a functional sensitivity of <=0.01 uIU/mL. Performed at [facility], [address], [place], [place] [address]   . Sodium 07/10/2018 134* 135 - 145 mmol/L Final  . Potassium 07/10/2018 3.8  3.5 - 5.1 mmol/L Final  . Chloride 07/10/2018 101  98 - 111 mmol/L Final  . CO2 07/10/2018 25  22 - 32 mmol/L Final  . Glucose, Bld 07/10/2018 91  70 - 99 mg/dL Final  . BUN 07/10/2018 19  6 - 20 mg/dL Final  . Creatinine, Ser 07/10/2018 1.23  0.61 - 1.24 mg/dL Final  . Calcium 07/10/2018 9.4  8.9 - 10.3 mg/dL Final  . Total Protein 07/10/2018 7.9  6.5 - 8.1 g/dL Final  . Albumin 07/10/2018 4.4  3.5 - 5.0 g/dL Final  . AST 07/10/2018 21  15 - 41 U/L Final  . ALT 07/10/2018 28  0 - 44 U/L Final  . Alkaline Phosphatase 07/10/2018 66  38 - 126 U/L Final  . Total Bilirubin 07/10/2018 1.5* 0.3 - 1.2 mg/dL Final  .  GFR calc non Af Amer 07/10/2018 >60  >60 mL/min Final  . GFR calc Af Amer 07/10/2018 >60  >60 mL/min Final   Comment: (NOTE) The eGFR has been calculated using the CKD EPI equation. This calculation has not been validated in all clinical situations. eGFR's persistently <60 mL/min signify possible Chronic Kidney Disease.   Georgiann Hahn gap 07/10/2018 8  5 - 15 Final   Performed at [facility], [address], [place], [place] [address]  . WBC 07/10/2018 6.5  4.0 - 10.5 K/uL Final  . RBC 07/10/2018 5.37  4.22 - 5.81 MIL/uL Final  . Hemoglobin 07/10/2018 16.4  13.0 - 17.0 g/dL Final  . HCT 07/10/2018 46.7  39.0 - 52.0 % Final  . MCV 07/10/2018 87.0  80.0 - 100.0 fL Final  . MCH 07/10/2018 30.5  26.0 - 34.0 pg Final  . MCHC 07/10/2018 35.1  30.0 - 36.0 g/dL Final  . RDW 07/10/2018 12.9  11.5 - 15.5 % Final  . Platelets 07/10/2018 136* 150 - 400 K/uL Final  . nRBC 07/10/2018 0.0  0.0 - 0.2 % Final  . Neutrophils Relative % 07/10/2018 61  % Final  . Neutro Abs 07/10/2018 4.0  1.7 - 7.7 K/uL Final  . Lymphocytes Relative 07/10/2018 28  % Final  . Lymphs Abs 07/10/2018 1.8  0.7 - 4.0 K/uL Final  . Monocytes Relative 07/10/2018 7  % Final  . Monocytes Absolute 07/10/2018 0.4  0.1 - 1.0 K/uL Final  . Eosinophils Relative 07/10/2018 3  % Final  . Eosinophils Absolute 07/10/2018 0.2  0.0 - 0.5 K/uL Final  . Basophils Relative 07/10/2018 1  % Final  . Basophils Absolute 07/10/2018 0.0  0.0 - 0.1 K/uL Final  . Immature Granulocytes 07/10/2018 0  % Final  . Abs Immature Granulocytes 07/10/2018 0.01  0.00 - 0.07 K/uL Final   Performed at [facility], [address], [place], [place] [address]    Assessment:  Abdulraheem Pineo. is an 42 y.o. male gentleman with hairy cell leukemia.  He presented with symptomatic splenomegaly and mild leukopenia and thrombocytopenia.  He  is 40 months s/p cladribine (09/30/2014 - 10/07/2014).  Counts are normal except for a slightly low  platelet count.    He has a history of severe abdominal pain. Abdominal and pelvic CT scan on 11/30/2014 revealed chronic cholelithiasis. He underwent cholecystectomy on 12/15/2014.   Abdominal pain resolved after changing jobs.  He has a history of intermittent mild hyperbilirubinemia (1.1 - 1.9).  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  Symptomatically, patient has not been feeling well.  He notes that he is fatigued, having abdominal fullness, and is losing weight.  Patient has been under a great deal of stress lately related to his job and participation in the charity dancing event.  No fevers, sweats, or bowel changes.  Documented weight loss is 6 pounds, however patient noted that he gained weight over the summer, which puts his weight loss at 14 pounds since his last visit.  Exam is unremarkable.  No labs obtained prior to clinic.  Plan: 1. Labs today: CBC with differential, CMP, TSH. 2. Hairy cell leukemia  Labs reviewed.  WBC 6500 (ANC 4000, ALC 1800).  Platelets low at 136,000 (baseline for patient).   Total bilirubin elevated at 1.5 mg/dL (direct bilirubin 0.1 mg/dL).  Initially presented with symptomatic splenomegaly and associated mild cytopenias, which is why patient is concerned.  Will obtain an abdominal ultrasound to sonographically assess for hepatosplenomegaly. 3. Weight loss  6 pound document weight loss since patient's last visit.  Patient advising that he "gained weight over the summer", which would make the amount of weight loss higher (14 pounds).  Discussed stress and anxiety as being potentially contributory.  Patient has also been more physically active as he prepared for a dancing competition over the last few months.  Will check TSH to ensure euthyroid state. 4. RTC as already scheduled on 08/26/2018 for MD assessment and labs (CBC with diff, CMP).   Honor Loh, NP  07/10/2018, 4:30 PM   I saw and evaluated the patient,  participating in the key portions of the service and reviewing pertinent diagnostic studies and records.  I reviewed the nurse practitioner's note and agree with the findings and the plan.  The assessment and plan were discussed with the patient.  Several questions were asked by the patient and answered.   Nolon Stalls, MD 07/10/2018,4:30 PM

## 2018-07-11 ENCOUNTER — Telehealth: Payer: Self-pay | Admitting: *Deleted

## 2018-07-11 NOTE — Telephone Encounter (Signed)
-----   Message from Lequita Asal, MD sent at 07/11/2018  6:59 AM EDT ----- Regarding: Please call patient with lab results  Labs look good.  Indirect bilirubin is slightly elevated (old) secondary to Gilbert's disease.  M  ----- Message ----- From: Buel Ream, Lab In [place] Sent: 07/10/2018   5:15 PM EDT To: Lequita Asal, MD

## 2018-07-11 NOTE — Telephone Encounter (Signed)
Called patient and LVM that his labs look good.  Indirect Bili is slightly elevated, however, MD believes this to be due to Gilbert's disease.

## 2018-07-15 ENCOUNTER — Ambulatory Visit: Payer: BC Managed Care – PPO

## 2018-08-21 ENCOUNTER — Other Ambulatory Visit: Payer: Self-pay

## 2018-08-21 DIAGNOSIS — C9141 Hairy cell leukemia, in remission: Secondary | ICD-10-CM

## 2018-08-26 ENCOUNTER — Encounter: Payer: Self-pay | Admitting: Hematology and Oncology

## 2018-08-26 ENCOUNTER — Other Ambulatory Visit: Payer: Self-pay

## 2018-08-26 ENCOUNTER — Inpatient Hospital Stay: Payer: BC Managed Care – PPO | Attending: Hematology and Oncology

## 2018-08-26 ENCOUNTER — Inpatient Hospital Stay: Payer: BC Managed Care – PPO | Admitting: Hematology and Oncology

## 2018-08-26 VITALS — BP 122/79 | HR 68 | Temp 98.2°F | Resp 16 | Wt 254.1 lb

## 2018-08-26 DIAGNOSIS — C9140 Hairy cell leukemia not having achieved remission: Secondary | ICD-10-CM

## 2018-08-26 DIAGNOSIS — C9141 Hairy cell leukemia, in remission: Secondary | ICD-10-CM

## 2018-08-26 DIAGNOSIS — Z800 Family history of malignant neoplasm of digestive organs: Secondary | ICD-10-CM

## 2018-08-26 LAB — CBC WITH DIFFERENTIAL/PLATELET
Abs Immature Granulocytes: 0.01 10*3/uL (ref 0.00–0.07)
Basophils Absolute: 0.1 10*3/uL (ref 0.0–0.1)
Basophils Relative: 1 %
Eosinophils Absolute: 0.2 10*3/uL (ref 0.0–0.5)
Eosinophils Relative: 3 %
HCT: 47.4 % (ref 39.0–52.0)
Hemoglobin: 16.5 g/dL (ref 13.0–17.0)
Immature Granulocytes: 0 %
Lymphocytes Relative: 25 %
Lymphs Abs: 1.4 10*3/uL (ref 0.7–4.0)
MCH: 29.7 pg (ref 26.0–34.0)
MCHC: 34.8 g/dL (ref 30.0–36.0)
MCV: 85.3 fL (ref 80.0–100.0)
Monocytes Absolute: 0.3 10*3/uL (ref 0.1–1.0)
Monocytes Relative: 6 %
Neutro Abs: 3.8 10*3/uL (ref 1.7–7.7)
Neutrophils Relative %: 65 %
Platelets: 137 10*3/uL — ABNORMAL LOW (ref 150–400)
RBC: 5.56 MIL/uL (ref 4.22–5.81)
RDW: 12.4 % (ref 11.5–15.5)
WBC: 5.8 10*3/uL (ref 4.0–10.5)
nRBC: 0 % (ref 0.0–0.2)

## 2018-08-26 LAB — COMPREHENSIVE METABOLIC PANEL
ALT: 25 U/L (ref 0–44)
AST: 30 U/L (ref 15–41)
Albumin: 4.3 g/dL (ref 3.5–5.0)
Alkaline Phosphatase: 69 U/L (ref 38–126)
Anion gap: 9 (ref 5–15)
BUN: 18 mg/dL (ref 6–20)
CO2: 20 mmol/L — ABNORMAL LOW (ref 22–32)
Calcium: 9.5 mg/dL (ref 8.9–10.3)
Chloride: 109 mmol/L (ref 98–111)
Creatinine, Ser: 1.18 mg/dL (ref 0.61–1.24)
GFR calc Af Amer: >60 mL/min (ref >60)
GFR calc non Af Amer: >60 mL/min (ref >60)
Glucose, Bld: 131 mg/dL — ABNORMAL HIGH (ref 70–99)
Potassium: 3.6 mmol/L (ref 3.5–5.1)
Sodium: 138 mmol/L (ref 135–145)
Total Bilirubin: 1.1 mg/dL (ref 0.3–1.2)
Total Protein: 7.6 g/dL (ref 6.5–8.1)

## 2018-08-26 NOTE — Progress Notes (Signed)
Pt reports still "having abdominal pain in left and mid area of abdomen".  Pt states he did not go get ultrasound after being seen in clinic in October.

## 2018-08-26 NOTE — Progress Notes (Signed)
[facility] day:  08/26/18  Chief Complaint: Kenneth Labs Kasperski [name] 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  HPI: The patient was last seen in the medical oncology clinic on 07/10/2018 for sick call visit.   At that time, he noted a sensation of abdominal fullness and intermittent periods of shortness of breath. He had lost weight.  He was notably more physically active and had 2 very successful businesses in town.  Exam was unremarkable.  Hematocrit was 46.7, hemoglobin 16.4, MCV 87, platelets 136,000, WBC 6500 with an ANC of 4000.  During the interim, patient has been feeling "alright". He complains of an episodes of significant abdominal pain on 08/18/2018. Pain was in his epigastric area with (+) radiation into the LEFT upper quadrant of his abdomen. Pain could have been precipitated by what he ate (chili).  Of note, patient decided again having ordered ultrasound imaging performed following his last clinic visit.   Patient denies any nausea, vomiting, or changes to his bowel habits. Patient denies that he has experienced any B symptoms. He denies any interval infections. Patient has not appreciated any new areas of palpable adenopathy.   Patient advises that he maintains an adequate appetite. He is eating well. Weight today is 254 lb 1 oz (115.2 kg), which compared to his last visit to the clinic, represents a 5 pound increase.     Patient denies pain in the clinic today.  Patient has a family history of colon cancer (maternal grandfather - age 33s). Patient concerned with hereditary risk. He has never had a colonoscopy.   Past Medical History:  Diagnosis Date  . Allergy    seasonal  . Hairy cell leukemia ([name])   . Hairy cell leukemia ([name]) 02/09/2015    Past Surgical History:  Procedure Laterality Date  . CHOLECYSTECTOMY  12/15/14  . HERNIA REPAIR  2011  . INGUINAL  HERNIA REPAIR    . ROTATOR CUFF REPAIR Right 2009, 2010    Family History  Problem Relation Age of Onset  . Hypertension Mother   . Hypertension Father   . Cancer Paternal Aunt   . Cancer Paternal Grandmother   . Cancer Paternal Grandfather     Social History:  reports that he has never smoked. He has never used smokeless tobacco. He reports that he drinks alcohol. He reports that he does not use drugs.  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  He works out at [employer] regularly.  The patient is alone today.  Allergies:  Allergies  Allergen Reactions  . Fentanyl Other (See Comments)    Pt states " knocks me out for days, I cant handle it at all"  . Diclofenac Other (See Comments)    GI DISTRESS   Current Medications: Current Outpatient Medications  Medication Sig Dispense Refill  . ibuprofen (ADVIL,MOTRIN) 200 MG tablet Take 200 mg by mouth every 6 (six) hours as needed.    . Multiple Vitamin (MULTIVITAMIN) tablet Take 1 tablet by mouth daily.    . Omega-3 Fatty Acids (FISH OIL) 500 MG CAPS Take 500 capsules by mouth.    . hydrocortisone (PROCTOSOL HC) 2.5 % rectal cream APPLY TOPICALLY TWICE DAILY AS NEEDED FOR HEMORRHOIDS  No current facility-administered medications for this visit.     Review of Systems  Constitutional: Negative for chills, diaphoresis, fever, malaise/fatigue and weight loss (up 5 pounds).       Feels "ok".  HENT: Negative.  Negative for congestion, ear pain, nosebleeds, sinus pain and sore throat.   Eyes: Negative.  Negative for blurred vision, double vision and photophobia.  Respiratory: Negative.  Negative for cough, hemoptysis, sputum production and shortness of breath.   Cardiovascular: Negative.  Negative for chest pain, palpitations, orthopnea, leg swelling and PND.  Gastrointestinal: Positive for abdominal pain (left  side). Negative for blood in stool, constipation, diarrhea, melena, nausea and vomiting.  Genitourinary: Negative.  Negative for dysuria, frequency, hematuria and urgency.  Musculoskeletal: Negative.  Negative for back pain, falls, joint pain, myalgias and neck pain.  Skin: Negative.  Negative for itching and rash.  Neurological: Negative.  Negative for dizziness, tremors, sensory change, speech change, focal weakness, weakness and headaches.  Endo/Heme/Allergies: Negative.   Psychiatric/Behavioral: Negative for depression and memory loss. The patient is not nervous/anxious and does not have insomnia.   All other systems reviewed and are negative.  Performance status (ECOG): 1  Vital Signs BP 122/79 (BP Location: Left Arm, Patient Position: Sitting)   Pulse 68   Temp 98.2 F (36.8 C) (Tympanic)   Resp 16   Wt 254 lb 1 oz (115.2 kg)   BMI 33.52 kg/m   Physical Exam  Constitutional: He is oriented to person, place, and time and well-developed, well-nourished, and in no distress. No distress.  HENT:  Head: Normocephalic and atraumatic.  Mouth/Throat: Oropharynx is clear and moist and mucous membranes are normal. No oropharyngeal exudate.  Alopecia. Dark eyebrows. Short beard.  Eyes: Pupils are equal, round, and reactive to light. Conjunctivae and EOM are normal. No scleral icterus.  Brown eyes.   Neck: Normal range of motion. Neck supple. No JVD present.  Cardiovascular: Normal rate, regular rhythm, normal heart sounds and intact distal pulses. Exam reveals no gallop and no friction rub.  No murmur heard. Pulmonary/Chest: Effort normal and breath sounds normal. No respiratory distress. He has no wheezes. He has no rales.  Abdominal: Soft. Bowel sounds are normal. He exhibits no distension and no mass. There is no abdominal tenderness. There is no rebound and no guarding.  Musculoskeletal: Normal range of motion.        General: No edema.  Lymphadenopathy:    He has no cervical  adenopathy.    He has no axillary adenopathy.       Right: No inguinal and no supraclavicular adenopathy present.       Left: No inguinal and no supraclavicular adenopathy present.  Neurological: He is alert and oriented to person, place, and time. Gait normal.  Skin: Skin is warm and dry. No rash noted. He is not diaphoretic. No erythema.  Psychiatric: Mood, affect and judgment normal.  Nursing note and vitals reviewed.   Orders Only on 08/26/2018  Component Date Value Ref Range Status  . Sodium 08/26/2018 138  135 - 145 mmol/L Final  . Potassium 08/26/2018 3.6  3.5 - 5.1 mmol/L Final  . Chloride 08/26/2018 109  98 - 111 mmol/L Final  . CO2 08/26/2018 20* 22 - 32 mmol/L Final  . Glucose, Bld 08/26/2018 131* 70 - 99 mg/dL Final  . BUN 08/26/2018 18  6 - 20 mg/dL Final  . Creatinine, Ser 08/26/2018 1.18  0.61 - 1.24 mg/dL Final  . Calcium 08/26/2018 9.5  8.9 -  10.3 mg/dL Final  . Total Protein 08/26/2018 7.6  6.5 - 8.1 g/dL Final  . Albumin 08/26/2018 4.3  3.5 - 5.0 g/dL Final  . AST 08/26/2018 30  15 - 41 U/L Final  . ALT 08/26/2018 25  0 - 44 U/L Final  . Alkaline Phosphatase 08/26/2018 69  38 - 126 U/L Final  . Total Bilirubin 08/26/2018 1.1  0.3 - 1.2 mg/dL Final  . GFR calc non Af Amer 08/26/2018 >60  >60 mL/min Final  . GFR calc Af Amer 08/26/2018 >60  >60 mL/min Final  . Anion gap 08/26/2018 9  5 - 15 Final   Performed at [facility], [address] [place], [place] [address]  . WBC 08/26/2018 5.8  4.0 - 10.5 K/uL Final  . RBC 08/26/2018 5.56  4.22 - 5.81 MIL/uL Final  . Hemoglobin 08/26/2018 16.5  13.0 - 17.0 g/dL Final  . HCT 08/26/2018 47.4  39.0 - 52.0 % Final  . MCV 08/26/2018 85.3  80.0 - 100.0 fL Final  . MCH 08/26/2018 29.7  26.0 - 34.0 pg Final  . MCHC 08/26/2018 34.8  30.0 - 36.0 g/dL Final  . RDW 08/26/2018 12.4  11.5 - 15.5 % Final  . Platelets 08/26/2018 137* 150 - 400 K/uL Final   REPEATED TO VERIFY  . nRBC 08/26/2018 0.0  0.0 - 0.2 % Final  .  Neutrophils Relative % 08/26/2018 65  % Final  . Neutro Abs 08/26/2018 3.8  1.7 - 7.7 K/uL Final  . Lymphocytes Relative 08/26/2018 25  % Final  . Lymphs Abs 08/26/2018 1.4  0.7 - 4.0 K/uL Final  . Monocytes Relative 08/26/2018 6  % Final  . Monocytes Absolute 08/26/2018 0.3  0.1 - 1.0 K/uL Final  . Eosinophils Relative 08/26/2018 3  % Final  . Eosinophils Absolute 08/26/2018 0.2  0.0 - 0.5 K/uL Final  . Basophils Relative 08/26/2018 1  % Final  . Basophils Absolute 08/26/2018 0.1  0.0 - 0.1 K/uL Final  . Immature Granulocytes 08/26/2018 0  % Final  . Abs Immature Granulocytes 08/26/2018 0.01  0.00 - 0.07 K/uL Final   Performed at [facility], [address] [place], [place] [address]    Assessment:  Kenneth Buell. is an 42 y.o. male gentleman with hairy cell leukemia.  He presented with symptomatic splenomegaly and mild leukopenia and thrombocytopenia.  He is 46 months s/p cladribine (09/30/2014 - 10/07/2014).  Counts are normal except for a slightly low platelet count.    He has a history of severe abdominal pain. Abdominal and pelvic CT scan on 11/30/2014 revealed chronic cholelithiasis. He underwent cholecystectomy on 12/15/2014.   Abdominal pain resolved after changing jobs.  He has a history of intermittent mild hyperbilirubinemia (1.1 - 1.9).  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  Symptomatically, he has had intermittent LUQ pain.  Exam reveals no palpable splenomegaly.  Labs are stable.   Plan: 1. Labs today: CBC with diff, CMP. 2. Hairy cell leukemia: Clinically doing well. Labs are stable. Hemoglobin 16.5, platelets 137,000, WBC 5800 ([place] 3800). Continue surveillance. 3. Family history of colon cancer: Typical screening at age 22. Patient's maternal grandfather had colon cancer in his 27s. Patient has had intermittent LUQ pain which he previously associated with his hairy cell leukemia (splenomegaly). Patient denies any  bleeding. Referral to GI (Dr Allen Norris). 4.  RTC in 6 months for MD assessment and labs (CBC with diff, CMP, direct bilirubin).   Honor Loh,  NP  08/26/2018, 12:02 PM   I saw and evaluated the patient, participating in the key portions of the service and reviewing pertinent diagnostic studies and records.  I reviewed the nurse practitioner's note and agree with the findings and the plan.  The assessment and plan were discussed with the patient.  Several questions were asked by the patient and answered.   Nolon Stalls, MD 08/26/2018,12:02 PM

## 2018-10-01 ENCOUNTER — Encounter: Payer: Self-pay | Admitting: *Deleted

## 2019-02-26 ENCOUNTER — Other Ambulatory Visit: Payer: BC Managed Care – PPO

## 2019-02-26 ENCOUNTER — Ambulatory Visit: Payer: BC Managed Care – PPO | Admitting: Hematology and Oncology

## 2019-03-25 ENCOUNTER — Inpatient Hospital Stay: Payer: BC Managed Care – PPO | Attending: Hematology and Oncology

## 2019-03-25 ENCOUNTER — Other Ambulatory Visit: Payer: Self-pay

## 2019-03-25 ENCOUNTER — Telehealth: Payer: Self-pay | Admitting: Hematology and Oncology

## 2019-03-25 DIAGNOSIS — C9141 Hairy cell leukemia, in remission: Secondary | ICD-10-CM | POA: Insufficient documentation

## 2019-03-25 LAB — COMPREHENSIVE METABOLIC PANEL
ALT: 23 U/L (ref 0–44)
AST: 20 U/L (ref 15–41)
Albumin: 4.4 g/dL (ref 3.5–5.0)
Alkaline Phosphatase: 63 U/L (ref 38–126)
Anion gap: 8 (ref 5–15)
BUN: 23 mg/dL — ABNORMAL HIGH (ref 6–20)
CO2: 24 mmol/L (ref 22–32)
Calcium: 9.2 mg/dL (ref 8.9–10.3)
Chloride: 102 mmol/L (ref 98–111)
Creatinine, Ser: 1.15 mg/dL (ref 0.61–1.24)
GFR calc Af Amer: >60 mL/min (ref >60)
GFR calc non Af Amer: >60 mL/min (ref >60)
Glucose, Bld: 85 mg/dL (ref 70–99)
Potassium: 3.7 mmol/L (ref 3.5–5.1)
Sodium: 134 mmol/L — ABNORMAL LOW (ref 135–145)
Total Bilirubin: 1 mg/dL (ref 0.3–1.2)
Total Protein: 7.6 g/dL (ref 6.5–8.1)

## 2019-03-25 LAB — CBC WITH DIFFERENTIAL/PLATELET
Abs Immature Granulocytes: 0.01 10*3/uL (ref 0.00–0.07)
Basophils Absolute: 0 10*3/uL (ref 0.0–0.1)
Basophils Relative: 0 %
Eosinophils Absolute: 0.1 10*3/uL (ref 0.0–0.5)
Eosinophils Relative: 2 %
HCT: 45.2 % (ref 39.0–52.0)
Hemoglobin: 16 g/dL (ref 13.0–17.0)
Immature Granulocytes: 0 %
Lymphocytes Relative: 31 %
Lymphs Abs: 1.8 10*3/uL (ref 0.7–4.0)
MCH: 30.8 pg (ref 26.0–34.0)
MCHC: 35.4 g/dL (ref 30.0–36.0)
MCV: 86.9 fL (ref 80.0–100.0)
Monocytes Absolute: 0.4 10*3/uL (ref 0.1–1.0)
Monocytes Relative: 7 %
Neutro Abs: 3.5 10*3/uL (ref 1.7–7.7)
Neutrophils Relative %: 60 %
Platelets: 130 10*3/uL — ABNORMAL LOW (ref 150–400)
RBC: 5.2 MIL/uL (ref 4.22–5.81)
RDW: 12.8 % (ref 11.5–15.5)
WBC: 5.8 10*3/uL (ref 4.0–10.5)
nRBC: 0 % (ref 0.0–0.2)

## 2019-03-25 LAB — BILIRUBIN, DIRECT: Bilirubin, Direct: 0.1 mg/dL (ref 0.0–0.2)

## 2019-03-26 ENCOUNTER — Inpatient Hospital Stay: Payer: BC Managed Care – PPO | Admitting: Hematology and Oncology

## 2019-03-26 ENCOUNTER — Other Ambulatory Visit: Payer: BC Managed Care – PPO

## 2019-03-26 NOTE — Progress Notes (Signed)
[facility]  [address], [address] [place], [place] [address] Phone: [phone]  Fax: [phone]   Clinic Day:  03/27/2019  Referring physician: Maryland Pink, MD  Chief Complaint: Kenneth Friedman. is a 43 y.o. 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   HPI: The patient was last seen in the medical oncology clinic on 08/26/2018. At that time, he had intermittent LUQ pain. Exam revealed no palpable splenomegaly.  CBC revealed a hematocrit of 47.4, hemoglobin 16.5, MCV 85.3, platelets 137,000, white count 5800 with an ANC of 3800.   Labs followed on 03/25/2019: WBC 5,800, hemoglobin 16.0, hematocrit 45.2, platelets 130,000, sodium 134, BUN 23, creatinine 1.15, direct bilirubin 0.1.  During the interim, he has done well.  His abdominal pain is improving.  His diet has improved. He reports trying to lose weight. His weight is down 4 lbs. He is staying very active and swimming.   He reports sharp pain in both big toes on each foot.    Past Medical History:  Diagnosis Date  . Allergy    seasonal  . Hairy cell leukemia ([place])   . Hairy cell leukemia ([place]) 02/09/2015    Past Surgical History:  Procedure Laterality Date  . CHOLECYSTECTOMY  12/15/14  . HERNIA REPAIR  2011  . INGUINAL HERNIA REPAIR    . ROTATOR CUFF REPAIR Right 2009, 2010    Family History  Problem Relation Age of Onset  . Hypertension Mother   . Hypertension Father   . Cancer Paternal Aunt   . Cancer Paternal Grandmother   . Cancer Paternal Grandfather     Social History:  reports that he has never smoked. He has never used smokeless tobacco. He reports current alcohol use. He reports that he does not use drugs. 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   He works out at [employer] regularly. The patient is alone today.  Allergies:  Allergies  Allergen Reactions  . Fentanyl Other (See Comments)    Pt states " knocks me out for days, I cant handle it at all"  . Diclofenac Other (See Comments)    GI DISTRESS    Current Medications: Current Outpatient Medications  Medication Sig Dispense Refill  . Multiple Vitamin (MULTIVITAMIN) tablet Take 1 tablet by mouth daily.    . Omega-3 Fatty Acids (FISH OIL) 500 MG CAPS Take 500 capsules by mouth.    . hydrocortisone (PROCTOSOL HC) 2.5 % rectal cream APPLY TOPICALLY TWICE DAILY AS NEEDED FOR HEMORRHOIDS    . ibuprofen (ADVIL,MOTRIN) 200 MG tablet Take 200 mg by mouth every 6 (six) hours as needed.     No current facility-administered medications for this visit.     Review of Systems  Constitutional: Positive for weight loss (down 4 lbs -intentional). Negative for chills, diaphoresis, fever and malaise/fatigue.       Doing good.  HENT: Negative.  Negative for congestion, ear pain, nosebleeds, sinus pain and sore throat.   Eyes: Negative.  Negative for blurred vision, double vision and photophobia.  Respiratory: Negative.  Negative for cough, hemoptysis, sputum production and shortness of breath.   Cardiovascular: Negative.  Negative for chest pain, palpitations, orthopnea, leg swelling and PND.  Gastrointestinal: Positive for  abdominal pain (left side, improved). Negative for blood in stool, constipation, diarrhea, melena, nausea and vomiting.  Genitourinary: Negative.  Negative for dysuria, frequency, hematuria and urgency.  Musculoskeletal: Negative.  Negative for back pain, falls, joint pain, myalgias and neck pain.  Skin: Negative.  Negative for itching and rash.  Neurological: Negative.  Negative for dizziness, tremors, sensory change, speech change, focal weakness, weakness and headaches.  Endo/Heme/Allergies: Negative.  Does not bruise/bleed easily.  Psychiatric/Behavioral: Negative for  depression and memory loss. The patient is not nervous/anxious and does not have insomnia.   All other systems reviewed and are negative.  Performance status (ECOG): 1  Vital Signs Blood pressure 127/83, pulse 70, temperature 97.6 F (36.4 C), temperature source Tympanic, resp. rate 18, height 6\' 1"  (1.854 m), weight 250 lb 15.9 oz (113.9 kg), SpO2 99 %.   Physical Exam  Constitutional: He is oriented to person, place, and time. He appears well-developed and well-nourished. No distress.  HENT:  Head: Normocephalic and atraumatic.  Mouth/Throat: Oropharynx is clear and moist. No oropharyngeal exudate.  Alopecia. Dark eyebrows. Short beard.  Eyes: Pupils are equal, round, and reactive to light. Conjunctivae and EOM are normal. No scleral icterus.  Brown eyes.  Neck: Normal range of motion. Neck supple. No JVD present.  Cardiovascular: Normal rate, regular rhythm and normal heart sounds.  No murmur heard. Pulmonary/Chest: Effort normal and breath sounds normal. No respiratory distress. He has no wheezes. He has no rales.  Abdominal: Soft. Bowel sounds are normal. He exhibits no distension and no mass. There is no abdominal tenderness. There is no rebound and no guarding.  Spleen not palpable.  Musculoskeletal: Normal range of motion.        General: No tenderness or edema.  Lymphadenopathy:    He has no cervical adenopathy.    He has no axillary adenopathy.       Right: No supraclavicular adenopathy present.       Left: No supraclavicular adenopathy present.  Neurological: He is alert and oriented to person, place, and time. He has normal reflexes.  Skin: Skin is warm and dry. No rash noted. He is not diaphoretic. No erythema. No pallor.  Psychiatric: He has a normal mood and affect. His behavior is normal. Judgment and thought content normal.  Nursing note and vitals reviewed.   Appointment on 03/25/2019  Component Date Value Ref Range Status  . Sodium 03/25/2019 134* 135 - 145  mmol/L Final  . Potassium 03/25/2019 3.7  3.5 - 5.1 mmol/L Final  . Chloride 03/25/2019 102  98 - 111 mmol/L Final  . CO2 03/25/2019 24  22 - 32 mmol/L Final  . Glucose, Bld 03/25/2019 85  70 - 99 mg/dL Final  . BUN 03/25/2019 23* 6 - 20 mg/dL Final  . Creatinine, Ser 03/25/2019 1.15  0.61 - 1.24 mg/dL Final  . Calcium 03/25/2019 9.2  8.9 - 10.3 mg/dL Final  . Total Protein 03/25/2019 7.6  6.5 - 8.1 g/dL Final  . Albumin 03/25/2019 4.4  3.5 - 5.0 g/dL Final  . AST 03/25/2019 20  15 - 41 U/L Final  . ALT 03/25/2019 23  0 - 44 U/L Final  . Alkaline Phosphatase 03/25/2019 63  38 - 126 U/L Final  . Total Bilirubin 03/25/2019 1.0  0.3 - 1.2 mg/dL Final  . GFR calc non Af Amer 03/25/2019 >60  >60 mL/min Final  . GFR calc Af Amer 03/25/2019 >60  >60 mL/min Final  . Anion gap 03/25/2019 8  5 -  15 Final   Performed at [facility], [address], [place], [place] [address]  . WBC 03/25/2019 5.8  4.0 - 10.5 K/uL Final  . RBC 03/25/2019 5.20  4.22 - 5.81 MIL/uL Final  . Hemoglobin 03/25/2019 16.0  13.0 - 17.0 g/dL Final  . HCT 03/25/2019 45.2  39.0 - 52.0 % Final  . MCV 03/25/2019 86.9  80.0 - 100.0 fL Final  . MCH 03/25/2019 30.8  26.0 - 34.0 pg Final  . MCHC 03/25/2019 35.4  30.0 - 36.0 g/dL Final  . RDW 03/25/2019 12.8  11.5 - 15.5 % Final  . Platelets 03/25/2019 130* 150 - 400 K/uL Final  . nRBC 03/25/2019 0.0  0.0 - 0.2 % Final  . Neutrophils Relative % 03/25/2019 60  % Final  . Neutro Abs 03/25/2019 3.5  1.7 - 7.7 K/uL Final  . Lymphocytes Relative 03/25/2019 31  % Final  . Lymphs Abs 03/25/2019 1.8  0.7 - 4.0 K/uL Final  . Monocytes Relative 03/25/2019 7  % Final  . Monocytes Absolute 03/25/2019 0.4  0.1 - 1.0 K/uL Final  . Eosinophils Relative 03/25/2019 2  % Final  . Eosinophils Absolute 03/25/2019 0.1  0.0 - 0.5 K/uL Final  . Basophils Relative 03/25/2019 0  % Final  . Basophils Absolute 03/25/2019 0.0  0.0 - 0.1 K/uL Final  . Immature Granulocytes 03/25/2019 0  %  Final  . Abs Immature Granulocytes 03/25/2019 0.01  0.00 - 0.07 K/uL Final   Performed at [facility], [address], [place], [place] [address]  . Bilirubin, Direct 03/25/2019 0.1  0.0 - 0.2 mg/dL Final   Performed at [facility], [address] [place], [place] [address]    Assessment:  Kenneth Zapf. is a 43 y.o. male with hairy cell leukemia.  He presented with symptomatic splenomegaly and mild leukopenia and thrombocytopenia.  He is 53 months s/p cladribine (09/30/2014 - 10/07/2014).  Counts are normal except for a slightly low platelet count.    He has a history of severe abdominal pain. Abdominal and pelvic CT scan on 11/30/2014 revealed chronic cholelithiasis. He underwent cholecystectomy on 12/15/2014.   Abdominal pain resolved after changing jobs.  He has a history of intermittent mild hyperbilirubinemia (1.1 - 1.9).  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  Symptomatically, he is doing well.  He denies any B symptoms.  Exam is unremarkable  Plan: 1.   Review labs from 03/25/2019. 2.   Hairy cell leukemia Clinically he continues to do well.   Hemoglobin 16.0, platelets 130,000, WBC 5800 ([place] 3500). Continue surveillance. 3.   Family history of colon cancer Typical screening at age 34. Patient's maternal grandfather had colon cancer in his 8s. Patient has had intermittent LUQ pain which he previously associated with his hairy cell leukemia (splenomegaly). Patient denies any GI bleeding. Patient previously referred to GI (Dr. Allen Norris) 4.   RTC in 6 months for MD assessment and labs (CBC with diff, CMP direct bilirubin).   I discussed the assessment and treatment plan with the patient.  The patient was provided an opportunity to ask questions and all were answered.  The patient agreed with the plan and demonstrated an understanding of the instructions.  The patient was advised to call back if the symptoms worsen  or if the condition fails to improve as anticipated.   Kenneth Asal, MD, PhD    03/27/2019, 10:19 AM  I, Selena Batten, am acting  as scribe for [place]. Mike Gip, MD, PhD.  I, Melissa C. Mike Gip, MD, have reviewed the above documentation for accuracy and completeness, and I agree with the above.

## 2019-03-27 ENCOUNTER — Encounter: Payer: Self-pay | Admitting: Hematology and Oncology

## 2019-03-27 ENCOUNTER — Other Ambulatory Visit: Payer: Self-pay

## 2019-03-27 ENCOUNTER — Inpatient Hospital Stay: Payer: BC Managed Care – PPO | Attending: Hematology and Oncology | Admitting: Hematology and Oncology

## 2019-03-27 VITALS — BP 127/83 | HR 70 | Temp 97.6°F | Resp 18 | Ht 73.0 in | Wt 251.0 lb

## 2019-03-27 DIAGNOSIS — D696 Thrombocytopenia, unspecified: Secondary | ICD-10-CM | POA: Diagnosis not present

## 2019-03-27 DIAGNOSIS — C9140 Hairy cell leukemia not having achieved remission: Secondary | ICD-10-CM | POA: Insufficient documentation

## 2019-03-27 DIAGNOSIS — R161 Splenomegaly, not elsewhere classified: Secondary | ICD-10-CM | POA: Insufficient documentation

## 2019-03-27 DIAGNOSIS — C9141 Hairy cell leukemia, in remission: Secondary | ICD-10-CM | POA: Diagnosis not present

## 2019-03-27 DIAGNOSIS — K8020 Calculus of gallbladder without cholecystitis without obstruction: Secondary | ICD-10-CM | POA: Diagnosis not present

## 2019-03-27 NOTE — Progress Notes (Signed)
Sharp pain noted to bilateral big toes x 1 month/ upper abdomen pain at times.

## 2019-09-25 ENCOUNTER — Inpatient Hospital Stay: Payer: BC Managed Care – PPO | Attending: Hematology and Oncology

## 2019-09-25 ENCOUNTER — Other Ambulatory Visit: Payer: Self-pay

## 2019-09-25 DIAGNOSIS — C9141 Hairy cell leukemia, in remission: Secondary | ICD-10-CM

## 2019-09-25 LAB — CBC WITH DIFFERENTIAL/PLATELET
Abs Immature Granulocytes: 0.02 10*3/uL (ref 0.00–0.07)
Basophils Absolute: 0 10*3/uL (ref 0.0–0.1)
Basophils Relative: 1 %
Eosinophils Absolute: 0.1 10*3/uL (ref 0.0–0.5)
Eosinophils Relative: 2 %
HCT: 45 % (ref 39.0–52.0)
Hemoglobin: 15.9 g/dL (ref 13.0–17.0)
Immature Granulocytes: 0 %
Lymphocytes Relative: 25 %
Lymphs Abs: 1.2 10*3/uL (ref 0.7–4.0)
MCH: 30.4 pg (ref 26.0–34.0)
MCHC: 35.3 g/dL (ref 30.0–36.0)
MCV: 86 fL (ref 80.0–100.0)
Monocytes Absolute: 0.3 10*3/uL (ref 0.1–1.0)
Monocytes Relative: 6 %
Neutro Abs: 3.2 10*3/uL (ref 1.7–7.7)
Neutrophils Relative %: 66 %
Platelets: 126 10*3/uL — ABNORMAL LOW (ref 150–400)
RBC: 5.23 MIL/uL (ref 4.22–5.81)
RDW: 12.9 % (ref 11.5–15.5)
WBC: 4.9 10*3/uL (ref 4.0–10.5)
nRBC: 0 % (ref 0.0–0.2)

## 2019-09-25 LAB — COMPREHENSIVE METABOLIC PANEL
ALT: 29 U/L (ref 0–44)
AST: 27 U/L (ref 15–41)
Albumin: 4.2 g/dL (ref 3.5–5.0)
Alkaline Phosphatase: 63 U/L (ref 38–126)
Anion gap: 9 (ref 5–15)
BUN: 21 mg/dL — ABNORMAL HIGH (ref 6–20)
CO2: 21 mmol/L — ABNORMAL LOW (ref 22–32)
Calcium: 9.4 mg/dL (ref 8.9–10.3)
Chloride: 108 mmol/L (ref 98–111)
Creatinine, Ser: 1.14 mg/dL (ref 0.61–1.24)
GFR calc Af Amer: >60 mL/min (ref >60)
GFR calc non Af Amer: >60 mL/min (ref >60)
Glucose, Bld: 113 mg/dL — ABNORMAL HIGH (ref 70–99)
Potassium: 3.6 mmol/L (ref 3.5–5.1)
Sodium: 138 mmol/L (ref 135–145)
Total Bilirubin: 1.4 mg/dL — ABNORMAL HIGH (ref 0.3–1.2)
Total Protein: 7.5 g/dL (ref 6.5–8.1)

## 2019-09-25 LAB — BILIRUBIN, DIRECT: Bilirubin, Direct: 0.1 mg/dL (ref 0.0–0.2)

## 2019-09-27 NOTE — Progress Notes (Signed)
[facility]  [address], [address] [place], [place] [address] Phone: [phone]  Fax: [phone]   Telemedicine Office Visit:  09/29/2019  Referring physician: Maryland Pink, MD  I connected with Kenneth Friedman. on 09/29/19 at 1:34 PM by videoconferencing and verified that I was speaking with the correct person using 2 identifiers.  The patient was at work.  I discussed the limitations, risk, security and privacy concerns of performing an evaluation and management service by videoconferencing and the availability of in person appointments.  I also discussed with the patient that there may be a patient responsible charge related to this service.  The patient expressed understanding and agreed to proceed.   Chief Complaint: Kenneth Friedman. 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   HPI: The patient was last seen in the medical oncology clinic on 03/27/2019. At that time, he was doing well.  He denied any B symptoms.  Exam was unremarkable.  CBC included a hematocrit of 45.2, hemoglobin 16, MCV 86.9, platelets 130,000, white count 5800 with an ANC of 3500.  Labs on 09/25/2019 showed a hematocrit 45.0, hemoglobin 15.9, MCV 86.0, platelets 126,000, and WBC 4900 ([name]). Total bilirubin was 1.4 (direct 0.1).  During the interim, hasn't had any illnesses. His lower back on his right side hurts, but he believes it comes from him lifting weights. His appetite is normal.  Symptomatically, he feels "alright".  He has a small rash on his lower leg; he believes something bit him.  He is putting cream on it and has seen improvement.  He works out and notices his body is more fatigued if he does intense workouts 4 days in a row. His schedule is now 2 days of workouts then a day off, then do another 2 days.  He wears a mask.  He gets his kids involved while working out.  He never followed up with Dr.  Allen Friedman.   Past Medical History:  Diagnosis Date  . Allergy    seasonal  . Hairy cell leukemia ([place])   . Hairy cell leukemia ([place]) 02/09/2015    Past Surgical History:  Procedure Laterality Date  . CHOLECYSTECTOMY  12/15/14  . HERNIA REPAIR  2011  . INGUINAL HERNIA REPAIR    . ROTATOR CUFF REPAIR Right 2009, 2010    Family History  Problem Relation Age of Onset  . Hypertension Mother   . Hypertension Father   . Cancer Paternal Aunt   . Cancer Paternal Grandmother   . Cancer Paternal Grandfather     Social History:  reports that he has never smoked. He has never used smokeless tobacco. He reports current alcohol use. He reports that he does not use drugs. 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 The event center has canceled venues secondary to COVID-19.  He works out at [employer] regularly. The  The patient is alone today.  Participants in the patient's visit and their role in the encounter included the patient, Kenneth Friedman, scribe, and [employer], CMA, today.  The intake visit was provided by Kenneth Friedman, scribe, and Kenneth Friedman, CMA.    Discussed avoiding crowds, wearing a face mask while in public, and practicing stringent handwashing.  Allergies:  Allergies  Allergen Reactions  . Fentanyl  Other (See Comments)    Pt states " knocks me out for days, I cant handle it at all"  . Diclofenac Other (See Comments)    GI DISTRESS    Current Medications: Current Outpatient Medications  Medication Sig Dispense Refill  . Multiple Vitamin (MULTIVITAMIN) tablet Take 1 tablet by mouth daily.    . Omega-3 Fatty Acids (FISH OIL) 500 MG CAPS Take 500 capsules by mouth.    . hydrocortisone (PROCTOSOL HC) 2.5 % rectal cream APPLY TOPICALLY TWICE DAILY AS NEEDED FOR HEMORRHOIDS    . ibuprofen (ADVIL,MOTRIN) 200 MG tablet Take 200 mg by  mouth every 6 (six) hours as needed.     No current facility-administered medications for this visit.    Review of Systems  Constitutional: Positive for weight loss (down 4 lbs -intentional). Negative for chills, diaphoresis, fever and malaise/fatigue.       Feels "fine".  HENT: Negative.  Negative for congestion, ear pain, nosebleeds, sinus pain and sore throat.   Eyes: Negative.  Negative for blurred vision, double vision and photophobia.  Respiratory: Negative.  Negative for cough, hemoptysis, sputum production and shortness of breath.   Cardiovascular: Negative.  Negative for chest pain, palpitations, orthopnea, leg swelling and PND.  Gastrointestinal: Negative.  Negative for abdominal pain (left side, resolved), blood in stool, constipation, diarrhea, melena, nausea and vomiting.  Genitourinary: Negative.  Negative for dysuria, frequency, hematuria and urgency.  Musculoskeletal: Negative.  Negative for back pain, falls, joint pain, myalgias and neck pain.  Skin: Negative.  Negative for itching and rash.  Neurological: Negative.  Negative for dizziness, sensory change, speech change, focal weakness, weakness and headaches.  Endo/Heme/Allergies: Negative.  Does not bruise/bleed easily.  Psychiatric/Behavioral: Negative.  Negative for depression and memory loss. The patient is not nervous/anxious and does not have insomnia.   All other systems reviewed and are negative.  Performance status (ECOG):  0  Vitals There were no vitals taken for this visit.   Physical Exam  Constitutional: He appears well-developed and well-nourished. No distress.  HENT:  Head: Normocephalic and atraumatic.  Alopecia.  Eyes: Conjunctivae and EOM are normal. No scleral icterus.  Skin: He is not diaphoretic.  Psychiatric: He has a normal mood and affect. His behavior is normal. Judgment and thought content normal.  Nursing note reviewed.   No visits with results within 3 Day(s) from this visit.  Latest  known visit with results is:  Appointment on 09/25/2019  Component Date Value Ref Range Status  . Bilirubin, Direct 09/25/2019 0.1  0.0 - 0.2 mg/dL Final   Performed at [facility], [address] [place], [place] [address]  . Sodium 09/25/2019 138  135 - 145 mmol/L Final  . Potassium 09/25/2019 3.6  3.5 - 5.1 mmol/L Final  . Chloride 09/25/2019 108  98 - 111 mmol/L Final  . CO2 09/25/2019 21* 22 - 32 mmol/L Final  . Glucose, Bld 09/25/2019 113* 70 - 99 mg/dL Final  . BUN 09/25/2019 21* 6 - 20 mg/dL Final  . Creatinine, Ser 09/25/2019 1.14  0.61 - 1.24 mg/dL Final  . Calcium 09/25/2019 9.4  8.9 - 10.3 mg/dL Final  . Total Protein 09/25/2019 7.5  6.5 - 8.1 g/dL Final  . Albumin 09/25/2019 4.2  3.5 - 5.0 g/dL Final  . AST 09/25/2019 27  15 - 41 U/L Final  . ALT 09/25/2019 29  0 - 44 U/L Final  . Alkaline Phosphatase 09/25/2019 63  38 - 126 U/L Final  .  Total Bilirubin 09/25/2019 1.4* 0.3 - 1.2 mg/dL Final  . GFR calc non Af Amer 09/25/2019 >60  >60 mL/min Final  . GFR calc Af Amer 09/25/2019 >60  >60 mL/min Final  . Anion gap 09/25/2019 9  5 - 15 Final   Performed at [facility], [address], [place], [place] [address]  . WBC 09/25/2019 4.9  4.0 - 10.5 K/uL Final  . RBC 09/25/2019 5.23  4.22 - 5.81 MIL/uL Final  . Hemoglobin 09/25/2019 15.9  13.0 - 17.0 g/dL Final  . HCT 09/25/2019 45.0  39.0 - 52.0 % Final  . MCV 09/25/2019 86.0  80.0 - 100.0 fL Final  . MCH 09/25/2019 30.4  26.0 - 34.0 pg Final  . MCHC 09/25/2019 35.3  30.0 - 36.0 g/dL Final  . RDW 09/25/2019 12.9  11.5 - 15.5 % Final  . Platelets 09/25/2019 126* 150 - 400 K/uL Final   Comment: Immature Platelet Fraction may be clinically indicated, consider ordering this additional test [id number]   . nRBC 09/25/2019 0.0  0.0 - 0.2 % Final  . Neutrophils Relative % 09/25/2019 66  % Final  . Neutro Abs 09/25/2019 3.2  1.7 - 7.7 K/uL Final  . Lymphocytes Relative 09/25/2019 25  % Final  . Lymphs  Abs 09/25/2019 1.2  0.7 - 4.0 K/uL Final  . Monocytes Relative 09/25/2019 6  % Final  . Monocytes Absolute 09/25/2019 0.3  0.1 - 1.0 K/uL Final  . Eosinophils Relative 09/25/2019 2  % Final  . Eosinophils Absolute 09/25/2019 0.1  0.0 - 0.5 K/uL Final  . Basophils Relative 09/25/2019 1  % Final  . Basophils Absolute 09/25/2019 0.0  0.0 - 0.1 K/uL Final  . Immature Granulocytes 09/25/2019 0  % Final  . Abs Immature Granulocytes 09/25/2019 0.02  0.00 - 0.07 K/uL Final   Performed at [facility], [address], [place], [place] [address]    Assessment:  Kenneth Richters. is a 44 y.o. male with hairy cell leukemia. He presented with symptomatic splenomegaly and mild leukopenia and thrombocytopenia. He is 37months s/p cladribine(09/30/2014 - 10/07/2014). Counts are normal except for a slightly low platelet count.   He has a history of severe abdominal pain. Abdominal and pelvic CT scan on 11/30/2014 revealed chronic cholelithiasis. He underwent cholecystectomy on 12/15/2014. Abdominal pain resolved after changing jobs.  He has a history of intermittentmild hyperbilirubinemia(1.1 - 1.9). 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  Symptomatically, he is doing well.  He denies any B symptoms.  He denies early satiety.  Bilirubin is 1.4 (direct 0.1).  Plan: 1.   Review labs from 09/25/2019. 2.   Hairy cell leukemia Clinically he is doing well.   Hematocrit 45.0.  Hemoglobin 15.9.  MCV 86.0. Platelets 126,000.  WBC 4900 ([place] 3200). Continue surveillance. 3.   Family history of colon cancer Typical screening at age 108. Patient's maternal grandfather had colon cancer in his 12s. Patient has had intermittent LUQ pain which he previously associated with his hairy cell leukemia (splenomegaly). Patient denies any GI bleeding. Patient previously referred to GI (Dr. Allen Friedman). Encourage follow-up with Dr Kenneth Friedman. 4.   RTC in 6 months for MD  assessment and labs (CBC with diff, CMP, direct bilirubin).  I discussed the assessment and treatment plan with the patient.  The patient was provided an opportunity to ask questions and all were answered.  The patient agreed with the plan and demonstrated an understanding of the instructions.  The patient was advised to call back if the symptoms worsen or if the condition fails to improve as anticipated.  I provided 17 minutes (1:34 PM - 1:50 PM) of face-to-face time during this this encounter and > 50% was spent counseling as documented under my assessment and plan.    Lequita Asal, MD, PhD    09/29/2019, 1:50 PM  I, Kenneth Friedman, am acting as a scribe for Lequita Asal, MD.  I, Lozano Mike Gip, MD, have reviewed the above documentation for accuracy and completeness, and I agree with the above.

## 2019-09-29 ENCOUNTER — Inpatient Hospital Stay: Payer: BC Managed Care – PPO | Attending: Hematology and Oncology | Admitting: Hematology and Oncology

## 2019-09-29 ENCOUNTER — Other Ambulatory Visit: Payer: BC Managed Care – PPO

## 2019-09-29 ENCOUNTER — Encounter: Payer: Self-pay | Admitting: Hematology and Oncology

## 2019-09-29 DIAGNOSIS — Z800 Family history of malignant neoplasm of digestive organs: Secondary | ICD-10-CM | POA: Diagnosis not present

## 2019-09-29 DIAGNOSIS — C9141 Hairy cell leukemia, in remission: Secondary | ICD-10-CM | POA: Diagnosis not present

## 2019-09-29 NOTE — Progress Notes (Signed)
No new changes noted today. The patient name and DOB has been verified by phone today. 

## 2019-09-30 ENCOUNTER — Telehealth: Payer: Self-pay | Admitting: Hematology and Oncology

## 2019-09-30 NOTE — Telephone Encounter (Signed)
Patient called and said he forgot to tell you 1 thing at his virtual Visit yesterday. He has developed 3 warts on his left thumb and is using Compund W to get rid of them.

## 2019-10-19 DIAGNOSIS — Z800 Family history of malignant neoplasm of digestive organs: Secondary | ICD-10-CM | POA: Insufficient documentation

## 2019-10-24 ENCOUNTER — Telehealth: Payer: Self-pay

## 2019-10-24 NOTE — Telephone Encounter (Signed)
Contacted patient regarding pain. Patient pain has decreased and is much more tolerable than earlier in the week. Informed patient he could be seen in symptom management today if he would like. Patient declined and stated he wanted to monitor it through the weekend and call back on Monday if he thought he needed to be seen.

## 2019-10-24 NOTE — Telephone Encounter (Signed)
-----   Message from Lequita Asal, MD sent at 10/22/2019  6:06 PM EST ----- Regarding: RE: pain Contact: [phone]  Would he like to come to the symptom management clinic?  M ----- Message ----- From: Secundino Ginger Sent: 10/22/2019  12:01 PM EST To: Arlan Organ, RN, # Subject: pain                                           Patient is having sharpe pain he said in his spleen.  Inches below his ribs on the left for about 5 days. He said he didn't kow if he needed to come in or wait.

## 2019-11-28 ENCOUNTER — Ambulatory Visit
Admission: RE | Admit: 2019-11-28 | Discharge: 2019-11-28 | Disposition: A | Discharge status: Home / Self Care | Payer: BC Managed Care – PPO | Source: Ambulatory Visit | Attending: Oncology | Admitting: Oncology

## 2019-11-28 ENCOUNTER — Inpatient Hospital Stay: Payer: BC Managed Care – PPO | Attending: Oncology | Admitting: Oncology

## 2019-11-28 ENCOUNTER — Telehealth: Payer: Self-pay

## 2019-11-28 ENCOUNTER — Other Ambulatory Visit: Payer: Self-pay

## 2019-11-28 VITALS — BP 124/79 | HR 68 | Temp 96.4°F | Resp 18 | Wt 260.0 lb

## 2019-11-28 DIAGNOSIS — M546 Pain in thoracic spine: Secondary | ICD-10-CM

## 2019-11-28 DIAGNOSIS — R1011 Right upper quadrant pain: Secondary | ICD-10-CM

## 2019-11-28 DIAGNOSIS — C9140 Hairy cell leukemia not having achieved remission: Secondary | ICD-10-CM | POA: Diagnosis present

## 2019-11-28 DIAGNOSIS — Z9221 Personal history of antineoplastic chemotherapy: Secondary | ICD-10-CM | POA: Insufficient documentation

## 2019-11-28 DIAGNOSIS — M5134 Other intervertebral disc degeneration, thoracic region: Secondary | ICD-10-CM | POA: Diagnosis not present

## 2019-11-28 DIAGNOSIS — R161 Splenomegaly, not elsewhere classified: Secondary | ICD-10-CM | POA: Diagnosis not present

## 2019-11-28 LAB — URINALYSIS, COMPLETE (UACMP) WITH MICROSCOPIC
Bacteria, UA: NONE SEEN
Bilirubin Urine: NEGATIVE
Glucose, UA: NEGATIVE mg/dL
Hgb urine dipstick: NEGATIVE
Ketones, ur: NEGATIVE mg/dL
Nitrite: NEGATIVE
Protein, ur: NEGATIVE mg/dL
Specific Gravity, Urine: 1.011 (ref 1.005–1.030)
Squamous Epithelial / HPF: NONE SEEN (ref 0–5)
pH: 5 (ref 5.0–8.0)

## 2019-11-28 LAB — CBC WITH DIFFERENTIAL/PLATELET
Abs Immature Granulocytes: 0.01 10*3/uL (ref 0.00–0.07)
Basophils Absolute: 0 10*3/uL (ref 0.0–0.1)
Basophils Relative: 1 %
Eosinophils Absolute: 0.1 10*3/uL (ref 0.0–0.5)
Eosinophils Relative: 2 %
HCT: 46 % (ref 39.0–52.0)
Hemoglobin: 15.9 g/dL (ref 13.0–17.0)
Immature Granulocytes: 0 %
Lymphocytes Relative: 26 %
Lymphs Abs: 1.5 10*3/uL (ref 0.7–4.0)
MCH: 30.1 pg (ref 26.0–34.0)
MCHC: 34.6 g/dL (ref 30.0–36.0)
MCV: 87 fL (ref 80.0–100.0)
Monocytes Absolute: 0.4 10*3/uL (ref 0.1–1.0)
Monocytes Relative: 8 %
Neutro Abs: 3.7 10*3/uL (ref 1.7–7.7)
Neutrophils Relative %: 63 %
Platelets: 151 10*3/uL (ref 150–400)
RBC: 5.29 MIL/uL (ref 4.22–5.81)
RDW: 12.6 % (ref 11.5–15.5)
WBC: 5.7 10*3/uL (ref 4.0–10.5)
nRBC: 0 % (ref 0.0–0.2)

## 2019-11-28 LAB — COMPREHENSIVE METABOLIC PANEL
ALT: 23 U/L (ref 0–44)
AST: 20 U/L (ref 15–41)
Albumin: 4.5 g/dL (ref 3.5–5.0)
Alkaline Phosphatase: 65 U/L (ref 38–126)
Anion gap: 10 (ref 5–15)
BUN: 21 mg/dL — ABNORMAL HIGH (ref 6–20)
CO2: 21 mmol/L — ABNORMAL LOW (ref 22–32)
Calcium: 9.7 mg/dL (ref 8.9–10.3)
Chloride: 106 mmol/L (ref 98–111)
Creatinine, Ser: 1.16 mg/dL (ref 0.61–1.24)
GFR calc Af Amer: >60 mL/min (ref >60)
GFR calc non Af Amer: >60 mL/min (ref >60)
Glucose, Bld: 94 mg/dL (ref 70–99)
Potassium: 4 mmol/L (ref 3.5–5.1)
Sodium: 137 mmol/L (ref 135–145)
Total Bilirubin: 1 mg/dL (ref 0.3–1.2)
Total Protein: 7.9 g/dL (ref 6.5–8.1)

## 2019-11-28 NOTE — Telephone Encounter (Signed)
-----   Message from Lequita Asal, MD sent at 11/28/2019 12:23 PM EST ----- Regarding: FW: Medical Concern  Please call the patient.  Would he be interested in going to the symptom management clinic?  He would likely first need some plain films of his back and then possibly an MRI.  Not sure it has anything to do with his hairy cell leukemia.  M ----- Message ----- From: [place] Sent: 11/28/2019  10:51 AM EST To: Arlan Organ, RN, # Subject: Medical Concern                                Pt called and said he has been having a persistent pain in his lower back on the right side for approx. 4 weeks. He wanted to speak with someone regarding this issue and see if he needs to come in and see Dr. Loletha Grayer sooner or if there are other OTC/Homeopathic things he can do to reduce his pain levels. Best # to reach him at is [phone] Thank you!

## 2019-11-28 NOTE — Progress Notes (Signed)
Kenneth Friedman     Symptom Management Consult note [facility]  [phone] [phone]  Patient Care Team: Maryland Pink, MD as PCP - General (Family Medicine) Christene Lye, MD (General Surgery) Lequita Asal, MD as Referring Physician (Hematology and Oncology)   Name of the patient: Kenneth Friedman  PT:[id number]  [birth year]   Date of visit: 11/28/2019   Diagnosis- 1. Right upper quadrant abdominal pain - CBC with Differential/Platelet - Comprehensive metabolic panel - DG Abd 2 Views; Future  2. Acute right-sided thoracic back pain - Urinalysis, Complete w Microscopic - DG Thoracic Spine 4V; Future  Other orders - aminocaproic acid (AMICAR) 25 % solution; Take 1 g by mouth every hour.   Chief complaint/ Reason for visit- RUQ abdominal pain and right sided thoracic pain  Heme/Onc history:  Oncology History Overview Note   Kenneth Hed. is a 44 y.o. male with hairy cell leukemia.  He presented with symptomatic splenomegaly and mild leukopenia and thrombocytopenia.  He is 53 months s/p cladribine (09/30/2014 - 10/07/2014).  Counts are normal except for a slightly low platelet count.     He has a history of severe abdominal pain. Abdominal and pelvic CT scan on 11/30/2014 revealed chronic cholelithiasis. He underwent cholecystectomy on 12/15/2014.   Abdominal pain resolved after changing jobs.   He has a history of intermittent mild hyperbilirubinemia (1.1 - 1.9).  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   Hairy cell leukemia ([place])  02/09/2015 Initial Diagnosis   Hairy cell leukemia ([facility])    Interval history-patient presents to symptom management for complaints of right upper abdominal pain x2 to 3 weeks and right-sided upper thoracic pain for 5 to 6 weeks.  Symptoms have been fairly persistent.  Initially he thought he may have pulled something while working out.  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  He has not had to take any medications and states it is just mild but he was just concerned given it has not gone away.  Right upper abdominal pain is new and comes and goes.  He denies any constipation or diarrhea.  He denies nausea or vomiting.  He denies any chest pain, shortness of breath or urinary concerns.  ECOG FS:1 - Symptomatic but completely ambulatory  Review of systems- Review of Systems  Constitutional: Negative.  Negative for chills, fever, malaise/fatigue and weight loss.  HENT: Negative for congestion, ear pain and tinnitus.   Eyes: Negative.  Negative for blurred vision and double vision.  Respiratory: Negative.  Negative for cough, sputum production and shortness of breath.   Cardiovascular: Negative.  Negative for chest pain, palpitations and leg swelling.  Gastrointestinal: Positive for abdominal pain (RLQ). Negative for constipation, diarrhea, nausea and vomiting.  Genitourinary: Negative for dysuria, frequency and urgency.  Musculoskeletal: Positive for back pain (Right sided thoracic pain). Negative for falls.  Skin: Negative.  Negative for rash.  Neurological: Negative.  Negative for weakness and headaches.  Endo/Heme/Allergies: Negative.  Does not bruise/bleed easily.  Psychiatric/Behavioral: Negative.  Negative for depression. The patient is not nervous/anxious and does not have insomnia.      Current treatment-surveillance  Allergies  Allergen Reactions  . Fentanyl Other (See Comments)    Pt states " knocks me out for days, I cant handle it at all"  . Diclofenac Other (See Comments)    GI DISTRESS     Past Medical History:  Diagnosis Date  . Allergy  seasonal  . Hairy cell leukemia ([place])   . Hairy cell leukemia ([place]) 02/09/2015     Past Surgical History:  Procedure Laterality Date  . CHOLECYSTECTOMY  12/15/14  . HERNIA REPAIR  2011  . INGUINAL HERNIA REPAIR    . ROTATOR CUFF REPAIR Right 2009, 2010    Social History    Socioeconomic History  . Marital status: Married    Spouse name: Not on file  . Number of children: Not on file  . Years of education: Not on file  . Highest education level: Not on file  Occupational History  . Not on file  Tobacco Use  . Smoking status: Never Smoker  . Smokeless tobacco: Never Used  Substance and Sexual Activity  . Alcohol use: Yes    Comment: rarely  . Drug use: No  . Sexual activity: Not on file  Other Topics Concern  . Not on file  Social History Narrative  . Not on file   Social Determinants of Health   Financial Resource Strain:   . Difficulty of Paying Living Expenses: Not on file  Food Insecurity:   . Worried About Charity fundraiser in the Last Year: Not on file  . Ran Out of Food in the Last Year: Not on file  Transportation Needs:   . Lack of Transportation (Medical): Not on file  . Lack of Transportation (Non-Medical): Not on file  Physical Activity:   . Days of Exercise per Week: Not on file  . Minutes of Exercise per Session: Not on file  Stress:   . Feeling of Stress : Not on file  Social Connections:   . Frequency of Communication with Friends and Family: Not on file  . Frequency of Social Gatherings with Friends and Family: Not on file  . Attends Religious Services: Not on file  . Active Member of Clubs or Organizations: Not on file  . Attends Archivist Meetings: Not on file  . Marital Status: Not on file  Intimate Partner Violence:   . Fear of Current or Ex-Partner: Not on file  . Emotionally Abused: Not on file  . Physically Abused: Not on file  . Sexually Abused: Not on file    Family History  Problem Relation Age of Onset  . Hypertension Mother   . Hypertension Father   . Cancer Paternal Aunt   . Cancer Paternal Grandmother   . Cancer Paternal Grandfather      Current Outpatient Medications:  .  aminocaproic acid (AMICAR) 25 % solution, Take 1 g by mouth every hour., Disp: , Rfl:  .  ibuprofen  (ADVIL,MOTRIN) 200 MG tablet, Take 200 mg by mouth every 6 (six) hours as needed., Disp: , Rfl:  .  Multiple Vitamin (MULTIVITAMIN) tablet, Take 1 tablet by mouth daily., Disp: , Rfl:  .  Omega-3 Fatty Acids (FISH OIL) 500 MG CAPS, Take 500 capsules by mouth., Disp: , Rfl:  .  hydrocortisone (PROCTOSOL HC) 2.5 % rectal cream, APPLY TOPICALLY TWICE DAILY AS NEEDED FOR HEMORRHOIDS, Disp: , Rfl:   Physical exam:  Vitals:   11/28/19 1509 11/28/19 1512  BP:  124/79  Pulse:  68  Resp:  18  Temp:  (!) 96.4 F (35.8 C)  TempSrc:  Tympanic  Weight: 260 lb (117.9 kg)    Physical Exam Constitutional:      Appearance: Normal appearance.  HENT:     Head: Normocephalic and atraumatic.  Eyes:     Pupils: Pupils are equal,  round, and reactive to light.  Cardiovascular:     Rate and Rhythm: Normal rate and regular rhythm.     Heart sounds: Normal heart sounds. No murmur.  Pulmonary:     Effort: Pulmonary effort is normal.     Breath sounds: Normal breath sounds. No wheezing.  Abdominal:     General: Bowel sounds are normal. There is no distension.     Palpations: Abdomen is soft. There is no hepatomegaly or splenomegaly.     Tenderness: There is abdominal tenderness in the right upper quadrant.  Musculoskeletal:        General: Normal range of motion.     Cervical back: Normal range of motion.     Comments: Tenderness with palpaion  Skin:    General: Skin is warm and dry.     Findings: No rash.  Neurological:     Mental Status: He is alert and oriented to person, place, and time.  Psychiatric:        Judgment: Judgment normal.      CMP Latest Ref Rng & Units 09/25/2019  Glucose 70 - 99 mg/dL 113(H)  BUN 6 - 20 mg/dL 21(H)  Creatinine 0.61 - 1.24 mg/dL 1.14  Sodium 135 - 145 mmol/L 138  Potassium 3.5 - 5.1 mmol/L 3.6  Chloride 98 - 111 mmol/L 108  CO2 22 - 32 mmol/L 21(L)  Calcium 8.9 - 10.3 mg/dL 9.4  Total Protein 6.5 - 8.1 g/dL 7.5  Total Bilirubin 0.3 - 1.2 mg/dL 1.4(H)    Alkaline Phos 38 - 126 U/L 63  AST 15 - 41 U/L 27  ALT 0 - 44 U/L 29   CBC Latest Ref Rng & Units 09/25/2019  WBC 4.0 - 10.5 K/uL 4.9  Hemoglobin 13.0 - 17.0 g/dL 15.9  Hematocrit 39.0 - 52.0 % 45.0  Platelets 150 - 400 K/uL 126(L)    No images are attached to the encounter.  No results found.  Assessment and plan- Patient is a 44 y.o. male who presents to symptom management for complaints of right-sided thoracic pain and right upper quadrant abdominal pain for the last 4 to 6 weeks.  Mr. Benenhaley is status post 76 months of cladribine completing in January 2016.  He is currently under surveillance with Dr. Mike Gip.  Thoracic back pain is likely muscular skeletal in nature but will get plain film with DG thoracic spine 2 view.  Right upper quadrant pain unclear etiology but will again get imaging to rule out acute abnormality.  No splenomegaly or adenopathy on exam.    Palpable splenomegaly is a classic feature of HCl.  Laboratory findings typically present with pancytopenia.   Additionally will add on labs (CBC, CMP) and UA.   Given imaging and lab work were stable, I do not believe additional work-up is needed at this time.  Plan: Add on labs. Stable labs. Abdominal x-ray and thoracic spine 2 view.  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  Disposition: Reviewed imaging with patient. RTC as scheduled.     Visit Diagnosis 1. Right upper quadrant abdominal pain   2. Acute right-sided thoracic back pain     Patient expressed understanding and was in agreement with this plan. He also understands that He can call clinic at any time with any questions, concerns, or complaints.   Greater than 50% was spent in counseling and coordination of care with this patient including but not limited to discussion of the  relevant topics above (See A&P) including, but not limited to diagnosis and management of acute and  chronic medical conditions.   Thank you for allowing me to participate in the care of this very pleasant patient.     Jacquelin Hawking, NP [place] at [facility] Cell - [phone] Pager- [phone] 11/28/2019 4:09 PM

## 2019-11-28 NOTE — Telephone Encounter (Signed)
Informed patient of Dr. Kem Parkinson recommendations for symptom management. Patient is agreeable and transferred to [facility] to schedule.

## 2020-03-30 NOTE — Progress Notes (Signed)
[facility]  [address], [address] [place], [place] [address] Phone: [phone]  Fax: [phone]   Clinic Day:  03/31/2020  Referring physician: Maryland Pink, MD  Chief Complaint: Kenneth Friedman. 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   HPI: The patient was last seen in the medical oncology clinic on 09/28/2018 via telemedicine. At that time,  he was doing well.  He denied any B symptoms.  He denied early satiety. Hematocrit was 45.0, hemoglobin 15.9, platelets 126,000, WBC 4,900. Total bilirubin was 1.4 (direct 0.1).  Surveillance continued.  He was referred to Dr Allen Norris.  The patient saw Faythe Casa, NP on 11/28/2019 for RUQ abdominal pain and acute right sided thoracic back pain. CBC was normal. Urinalysis revealed trace leukocytes. Abdominal x ray revealed prominent stool in the right colon. Thoracic spine x ray revealed no acute abnormalities. There were mild degenerative disc disease changes.  During the interim, he has been doing well aside from his back pain. He has had lower back pain for years, but now he is having pain on the sides too. His abdominal pain has resolved. He is starting to have shoulder pain again due to the exercise program he is doing. He is trying to lose 10 lbs in the next 10 weeks. He takes multivitamins, Vit D3, BCAAs, and fish oils. He stopped taking creatine.   Past Medical History:  Diagnosis Date  . Allergy    seasonal  . Hairy cell leukemia ([place])   . Hairy cell leukemia ([place]) 02/09/2015    Past Surgical History:  Procedure Laterality Date  . CHOLECYSTECTOMY  12/15/14  . HERNIA REPAIR  2011  . INGUINAL HERNIA REPAIR    . ROTATOR CUFF REPAIR Right 2009, 2010    Family History  Problem Relation Age of Onset  . Hypertension Mother   . Hypertension Father   . Cancer Paternal Aunt   . Cancer Paternal Grandmother   . Cancer Paternal Grandfather      Social History:  reports that he has never smoked. He has never used smokeless tobacco. He reports current alcohol use. He reports that he does not use drugs. 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 The event center has canceled venues secondary to COVID-19.  He works out at [employer] regularly. The patient is alone today.   Allergies:  Allergies  Allergen Reactions  . Fentanyl Other (See Comments)    Pt states " knocks me out for days, I cant handle it at all"  . Diclofenac Other (See Comments)    GI DISTRESS    Current Medications: Current Outpatient Medications  Medication Sig Dispense Refill  . aminocaproic acid (AMICAR) 25 % solution Take 1 g by mouth every hour.    . hydrocortisone (PROCTOSOL HC) 2.5 % rectal cream APPLY TOPICALLY TWICE DAILY AS NEEDED FOR HEMORRHOIDS    . ibuprofen (ADVIL,MOTRIN) 200 MG tablet Take 200 mg by mouth every 6 (six) hours as needed.    . Multiple Vitamin (MULTIVITAMIN) tablet Take 1 tablet by mouth daily.    . Omega-3 Fatty Acids (FISH OIL) 500 MG CAPS Take 500 capsules by mouth.    . tobramycin (TOBREX) 0.3 % ophthalmic solution SMARTSIG:1-2 Drop(s) Left Eye Every 4 Hours     No current  facility-administered medications for this visit.    Review of Systems  Constitutional: Positive for weight loss (trying to lose 10 lbs). Negative for chills, diaphoresis, fever and malaise/fatigue.       Feels "fine".  HENT: Negative.  Negative for congestion, ear pain, nosebleeds, sinus pain and sore throat.   Eyes: Negative.  Negative for blurred vision, double vision and photophobia.  Respiratory: Negative.  Negative for cough, hemoptysis, sputum production and shortness of breath.   Cardiovascular: Negative.  Negative for chest pain, palpitations, orthopnea, leg swelling and PND.  Gastrointestinal: Negative.  Negative  for abdominal pain, blood in stool, constipation, diarrhea, heartburn, melena, nausea and vomiting.  Genitourinary: Negative.  Negative for dysuria, frequency, hematuria and urgency.  Musculoskeletal: Positive for back pain and joint pain (shoulder). Negative for falls, myalgias and neck pain.  Skin: Negative.  Negative for itching and rash.  Neurological: Negative.  Negative for dizziness, sensory change, speech change, focal weakness, weakness and headaches.  Endo/Heme/Allergies: Negative.  Does not bruise/bleed easily.  Psychiatric/Behavioral: Negative.  Negative for depression and memory loss. The patient is not nervous/anxious and does not have insomnia.   All other systems reviewed and are negative.  Performance status (ECOG):  0  Vitals Blood pressure 122/77, pulse 67, temperature (!) 95 F (35 C), temperature source Tympanic, resp. rate 18, weight 253 lb 8.5 oz (115 kg), SpO2 98 %.   Physical Exam Vitals and nursing note reviewed.  Constitutional:      General: He is not in acute distress.    Appearance: He is well-developed. He is not diaphoretic.  HENT:     Head: Normocephalic and atraumatic.     Mouth/Throat:     Mouth: Mucous membranes are moist.     Pharynx: Oropharynx is clear.  Eyes:     General: No scleral icterus.    Extraocular Movements: Extraocular movements intact.     Conjunctiva/sclera: Conjunctivae normal.     Pupils: Pupils are equal, round, and reactive to light.  Cardiovascular:     Rate and Rhythm: Normal rate and regular rhythm.     Heart sounds: Normal heart sounds. No murmur heard.   Pulmonary:     Effort: No respiratory distress.     Breath sounds: Normal breath sounds. No wheezing or rales.  Chest:     Chest wall: No tenderness.  Abdominal:     General: Bowel sounds are normal. There is no distension.     Palpations: Abdomen is soft. There is no hepatomegaly, splenomegaly or mass.     Tenderness: There is no abdominal tenderness. There is no  guarding or rebound.  Musculoskeletal:        General: Normal range of motion.     Cervical back: Normal range of motion and neck supple.  Lymphadenopathy:     Head:     Right side of head: No preauricular, posterior auricular or occipital adenopathy.     Left side of head: No preauricular, posterior auricular or occipital adenopathy.     Cervical: No cervical adenopathy.     Upper Body:     Right upper body: No supraclavicular or axillary adenopathy.     Left upper body: No supraclavicular or axillary adenopathy.     Lower Body: No right inguinal adenopathy. No left inguinal adenopathy.  Skin:    General: Skin is warm and dry.  Neurological:     Mental Status: He is alert and oriented to person, place, and time.  Psychiatric:  Behavior: Behavior normal.        Thought Content: Thought content normal.        Judgment: Judgment normal.    Appointment on 03/31/2020  Component Date Value Ref Range Status  . WBC 03/31/2020 5.4  4.0 - 10.5 K/uL Final  . RBC 03/31/2020 5.09  4.22 - 5.81 MIL/uL Final  . Hemoglobin 03/31/2020 15.5  13.0 - 17.0 g/dL Final  . HCT 03/31/2020 43.2  39 - 52 % Final  . MCV 03/31/2020 84.9  80.0 - 100.0 fL Final  . MCH 03/31/2020 30.5  26.0 - 34.0 pg Final  . MCHC 03/31/2020 35.9  30.0 - 36.0 g/dL Final  . RDW 03/31/2020 12.9  11.5 - 15.5 % Final  . Platelets 03/31/2020 126* 150 - 400 K/uL Final   Comment: Immature Platelet Fraction may be clinically indicated, consider ordering this additional test [id number]   . nRBC 03/31/2020 0.0  0.0 - 0.2 % Final  . Neutrophils Relative % 03/31/2020 64  % Final  . Neutro Abs 03/31/2020 3.4  1.7 - 7.7 K/uL Final  . Lymphocytes Relative 03/31/2020 29  % Final  . Lymphs Abs 03/31/2020 1.6  0.7 - 4.0 K/uL Final  . Monocytes Relative 03/31/2020 6  % Final  . Monocytes Absolute 03/31/2020 0.3  0 - 1 K/uL Final  . Eosinophils Relative 03/31/2020 1  % Final  . Eosinophils Absolute 03/31/2020 0.1  0 - 0 K/uL Final   . Basophils Relative 03/31/2020 0  % Final  . Basophils Absolute 03/31/2020 0.0  0 - 0 K/uL Final  . Immature Granulocytes 03/31/2020 0  % Final  . Abs Immature Granulocytes 03/31/2020 0.02  0.00 - 0.07 K/uL Final   Performed at [facility], [address], [place], [place] [address]  . Sodium 03/31/2020 137  135 - 145 mmol/L Final  . Potassium 03/31/2020 3.6  3.5 - 5.1 mmol/L Final  . Chloride 03/31/2020 105  98 - 111 mmol/L Final  . CO2 03/31/2020 23  22 - 32 mmol/L Final  . Glucose, Bld 03/31/2020 126* 70 - 99 mg/dL Final   Glucose reference range applies only to samples taken after fasting for at least 8 hours.  . BUN 03/31/2020 25* 6 - 20 mg/dL Final  . Creatinine, Ser 03/31/2020 1.24  0.61 - 1.24 mg/dL Final  . Calcium 03/31/2020 9.3  8.9 - 10.3 mg/dL Final  . Total Protein 03/31/2020 7.4  6.5 - 8.1 g/dL Final  . Albumin 03/31/2020 4.3  3.5 - 5.0 g/dL Final  . AST 03/31/2020 21  15 - 41 U/L Final  . ALT 03/31/2020 24  0 - 44 U/L Final  . Alkaline Phosphatase 03/31/2020 55  38 - 126 U/L Final  . Total Bilirubin 03/31/2020 1.0  0.3 - 1.2 mg/dL Final  . GFR calc non Af Amer 03/31/2020 >60  >60 mL/min Final  . GFR calc Af Amer 03/31/2020 >60  >60 mL/min Final  . Anion gap 03/31/2020 9  5 - 15 Final   Performed at [facility], [address] [place], [place] [address]  . Bilirubin, Direct 03/31/2020 0.1  0.0 - 0.2 mg/dL Final   Performed at [facility], [address], [place], [place] [address]    Assessment:  Kenneth Friedman. is a 44 y.o. male with hairy cell leukemia. He presented with symptomatic splenomegaly and mild leukopenia and thrombocytopenia. He is 65months s/p cladribine(09/30/2014 - 10/07/2014). Counts are normal except for  a slightly low platelet count.   He has a history of severe abdominal pain. Abdominal and pelvic CT scan on 11/30/2014 revealed chronic cholelithiasis. He underwent cholecystectomy on  12/15/2014. Abdominal pain resolved after changing jobs.  He has a history of intermittentmild hyperbilirubinemia(1.1 - 1.9). 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  Symptomatically, he is doing well.  He denies any infections or bruising/bleeding.  Exam reveals no adenopathy or hepatosplenomegaly.  Plan: 1.   Labs today: CBC with diff, CMP, direct bilirubin 2.   Hairy cell leukemia Clinically, he continues to do well.  Hematocrit 43.2.  Hemoglobin 15.5.  MCV 84.9.  Platelets 126,000.  WBC 5400 with an ANC of 3400. Several questions asked and answered. Continue surveillance. 3.    RTC in 6 months for MD assessment and labs (CBC with diff, CMP, direct bilirubin).  I discussed the assessment and treatment plan with the patient.  The patient was provided an opportunity to ask questions and all were answered.  The patient agreed with the plan and demonstrated an understanding of the instructions.  The patient was advised to call back if the symptoms worsen or if the condition fails to improve as anticipated.  I provided 20 minutes of face-to-face time during this this encounter and > 50% was spent counseling as documented under my assessment and plan.    Lequita Asal, MD, PhD    03/31/2020, 2:34 PM   I, Mirian Mo Tufford, am acting as a Education administrator for Lequita Asal, MD.  I, [place] Mike Gip, MD, have reviewed the above documentation for accuracy and completeness, and I agree with the above.

## 2020-03-31 ENCOUNTER — Inpatient Hospital Stay (HOSPITAL_BASED_OUTPATIENT_CLINIC_OR_DEPARTMENT_OTHER): Payer: BC Managed Care – PPO | Admitting: Hematology and Oncology

## 2020-03-31 ENCOUNTER — Encounter: Payer: Self-pay | Admitting: Hematology and Oncology

## 2020-03-31 ENCOUNTER — Other Ambulatory Visit: Payer: Self-pay

## 2020-03-31 ENCOUNTER — Inpatient Hospital Stay: Payer: BC Managed Care – PPO | Attending: Hematology and Oncology

## 2020-03-31 VITALS — BP 122/77 | HR 67 | Temp 95.0°F | Resp 18 | Wt 253.5 lb

## 2020-03-31 DIAGNOSIS — C9141 Hairy cell leukemia, in remission: Secondary | ICD-10-CM

## 2020-03-31 DIAGNOSIS — Z9221 Personal history of antineoplastic chemotherapy: Secondary | ICD-10-CM | POA: Diagnosis not present

## 2020-03-31 DIAGNOSIS — M546 Pain in thoracic spine: Secondary | ICD-10-CM | POA: Diagnosis not present

## 2020-03-31 DIAGNOSIS — C9140 Hairy cell leukemia not having achieved remission: Secondary | ICD-10-CM | POA: Diagnosis present

## 2020-03-31 DIAGNOSIS — G8929 Other chronic pain: Secondary | ICD-10-CM | POA: Insufficient documentation

## 2020-03-31 DIAGNOSIS — R1011 Right upper quadrant pain: Secondary | ICD-10-CM | POA: Diagnosis not present

## 2020-03-31 LAB — COMPREHENSIVE METABOLIC PANEL
ALT: 24 U/L (ref 0–44)
AST: 21 U/L (ref 15–41)
Albumin: 4.3 g/dL (ref 3.5–5.0)
Alkaline Phosphatase: 55 U/L (ref 38–126)
Anion gap: 9 (ref 5–15)
BUN: 25 mg/dL — ABNORMAL HIGH (ref 6–20)
CO2: 23 mmol/L (ref 22–32)
Calcium: 9.3 mg/dL (ref 8.9–10.3)
Chloride: 105 mmol/L (ref 98–111)
Creatinine, Ser: 1.24 mg/dL (ref 0.61–1.24)
GFR calc Af Amer: >60 mL/min (ref >60)
GFR calc non Af Amer: >60 mL/min (ref >60)
Glucose, Bld: 126 mg/dL — ABNORMAL HIGH (ref 70–99)
Potassium: 3.6 mmol/L (ref 3.5–5.1)
Sodium: 137 mmol/L (ref 135–145)
Total Bilirubin: 1 mg/dL (ref 0.3–1.2)
Total Protein: 7.4 g/dL (ref 6.5–8.1)

## 2020-03-31 LAB — CBC WITH DIFFERENTIAL/PLATELET
Abs Immature Granulocytes: 0.02 10*3/uL (ref 0.00–0.07)
Basophils Absolute: 0 10*3/uL (ref 0.0–0.1)
Basophils Relative: 0 %
Eosinophils Absolute: 0.1 10*3/uL (ref 0.0–0.5)
Eosinophils Relative: 1 %
HCT: 43.2 % (ref 39.0–52.0)
Hemoglobin: 15.5 g/dL (ref 13.0–17.0)
Immature Granulocytes: 0 %
Lymphocytes Relative: 29 %
Lymphs Abs: 1.6 10*3/uL (ref 0.7–4.0)
MCH: 30.5 pg (ref 26.0–34.0)
MCHC: 35.9 g/dL (ref 30.0–36.0)
MCV: 84.9 fL (ref 80.0–100.0)
Monocytes Absolute: 0.3 10*3/uL (ref 0.1–1.0)
Monocytes Relative: 6 %
Neutro Abs: 3.4 10*3/uL (ref 1.7–7.7)
Neutrophils Relative %: 64 %
Platelets: 126 10*3/uL — ABNORMAL LOW (ref 150–400)
RBC: 5.09 MIL/uL (ref 4.22–5.81)
RDW: 12.9 % (ref 11.5–15.5)
WBC: 5.4 10*3/uL (ref 4.0–10.5)
nRBC: 0 % (ref 0.0–0.2)

## 2020-03-31 LAB — BILIRUBIN, DIRECT: Bilirubin, Direct: 0.1 mg/dL (ref 0.0–0.2)

## 2020-03-31 NOTE — Progress Notes (Signed)
Patient is having chronic back that radiates into legs. The pain is keeping him awake at night.

## 2020-05-04 ENCOUNTER — Encounter: Payer: Self-pay | Admitting: Nurse Practitioner

## 2020-05-04 ENCOUNTER — Telehealth: Payer: Self-pay | Admitting: *Deleted

## 2020-05-04 ENCOUNTER — Telehealth (HOSPITAL_COMMUNITY): Payer: Self-pay | Admitting: Nurse Practitioner

## 2020-05-04 DIAGNOSIS — U071 COVID-19: Secondary | ICD-10-CM

## 2020-05-04 NOTE — Telephone Encounter (Signed)
Wife Olivia Mackie called reporting that patient tested positive for COVID yesterday and she would like a return call to be sure that she is treating him correctly since he has Leukemia. Please return her call [phone]

## 2020-05-04 NOTE — Telephone Encounter (Signed)
Called to Discuss with patient about Covid symptoms and the use of regeneron, a monoclonal antibody infusion for those with mild to moderate Covid symptoms and at a high risk of hospitalization.     Pt is qualified for this infusion at the [place] infusion center due to co-morbid conditions and/or a member of an at-risk group.     Spoke to patient's wife. Patient is asleepl. Onset of symptoms was 8/6. Tested positive at [facility]. She would like to planf or patient to get infusion. We discussed infusion in detail. He will call me back to discuss in more detail and get consent.   Beckey Rutter, [place], AGNP-C [phone] ([place])

## 2020-05-04 NOTE — Telephone Encounter (Signed)
  Please call patient and ask about symptoms.  M

## 2020-05-05 ENCOUNTER — Ambulatory Visit (HOSPITAL_COMMUNITY): Payer: BC Managed Care – PPO

## 2020-05-06 ENCOUNTER — Ambulatory Visit (HOSPITAL_COMMUNITY)
Admission: RE | Admit: 2020-05-06 | Discharge: 2020-05-06 | Disposition: A | Discharge status: Home / Self Care | Payer: BC Managed Care – PPO | Source: Ambulatory Visit | Attending: Pulmonary Disease | Admitting: Pulmonary Disease

## 2020-05-06 ENCOUNTER — Other Ambulatory Visit: Payer: Self-pay | Admitting: Nurse Practitioner

## 2020-05-06 DIAGNOSIS — U071 COVID-19: Secondary | ICD-10-CM

## 2020-05-06 MED ORDER — SODIUM CHLORIDE 0.9 % IV SOLN
1200.0000 mg | Freq: Once | INTRAVENOUS | Status: AC
  Administered 2020-05-06: 1200 mg via INTRAVENOUS
  Filled 2020-05-06: qty 400

## 2020-05-06 MED ORDER — DIPHENHYDRAMINE HCL 50 MG/ML IJ SOLN: 50.0000 mg | Freq: Once | INTRAMUSCULAR | Status: DC | PRN

## 2020-05-06 MED ORDER — ALBUTEROL SULFATE HFA 108 (90 BASE) MCG/ACT IN AERS: 2.0000 | INHALATION_SPRAY | Freq: Once | RESPIRATORY_TRACT | Status: DC | PRN

## 2020-05-06 MED ORDER — SODIUM CHLORIDE 0.9 % IV SOLN: INTRAVENOUS | Status: DC | PRN

## 2020-05-06 MED ORDER — METHYLPREDNISOLONE SODIUM SUCC 125 MG IJ SOLR: 125.0000 mg | Freq: Once | INTRAMUSCULAR | Status: DC | PRN

## 2020-05-06 MED ORDER — FAMOTIDINE IN NACL 20-0.9 MG/50ML-% IV SOLN: 20.0000 mg | Freq: Once | INTRAVENOUS | Status: DC | PRN

## 2020-05-06 MED ORDER — EPINEPHRINE 0.3 MG/0.3ML IJ SOAJ: 0.3000 mg | Freq: Once | INTRAMUSCULAR | Status: DC | PRN

## 2020-05-06 NOTE — Progress Notes (Addendum)
  Diagnosis: COVID-19  Physician:  Dr. Patrick Wright  Procedure: Covid Infusion Clinic Med: casirivimab\imdevimab infusion - Provided patient with casirivimab\imdevimab fact sheet for patients, parents and caregivers prior to infusion.  Complications: No immediate complications noted.  Discharge: Discharged home   Kenneth Friedman G 05/06/2020  

## 2020-05-06 NOTE — Progress Notes (Signed)
I connected by phone with Kenneth Friedman. on 05/04/2020 at 16:55 PM to discuss the potential use of an new treatment for mild to moderate COVID-19 viral infection in non-hospitalized patients.  This patient is a 44 y.o. male that meets the FDA criteria for Emergency Use Authorization of casirivimab\imdevimab.  Has a (+) direct SARS-CoV-2 viral test result  Has mild or moderate COVID-19   Is ? 45 years of age and weighs ? 40 kg  Is NOT hospitalized due to COVID-19  Is NOT requiring oxygen therapy or requiring an increase in baseline oxygen flow rate due to COVID-19  Is within 10 days of symptom onset  Has at least one of the high risk factor(s) for progression to severe COVID-19 and/or hospitalization as defined in EUA.  Specific high risk criteria : Immunosuppressive Disease or Treatment (hx of leukemia), and obesity.    I have spoken and communicated the following to the patient or parent/caregiver:  1. FDA has authorized the emergency use of casirivimab\imdevimab for the treatment of mild to moderate COVID-19 in adults and pediatric patients with positive results of direct SARS-CoV-2 viral testing who are 74 years of age and older weighing at least 40 kg, and who are at high risk for progressing to severe COVID-19 and/or hospitalization.  2. The significant known and potential risks and benefits of casirivimab\imdevimab, and the extent to which such potential risks and benefits are unknown.  3. Information on available alternative treatments and the risks and benefits of those alternatives, including clinical trials.  4. Patients treated with casirivimab\imdevimab should continue to self-isolate and use infection control measures (e.g., wear mask, isolate, social distance, avoid sharing personal items, clean and disinfect high touch surfaces, and frequent handwashing) according to CDC guidelines.   5. The patient or parent/caregiver has the option to accept or refuse  casirivimab\imdevimab .  After reviewing this information with the patient, The patient agreed to proceed with receiving casirivimab\imdevimab infusion and will be provided a copy of the Fact sheet prior to receiving the infusion.Beckey Rutter, [place], AGNP-C [phone] ([place])

## 2020-05-06 NOTE — Discharge Instructions (Signed)

## 2020-09-28 ENCOUNTER — Other Ambulatory Visit: Payer: Self-pay

## 2020-09-28 DIAGNOSIS — C9141 Hairy cell leukemia, in remission: Secondary | ICD-10-CM

## 2020-09-28 NOTE — Progress Notes (Signed)
[facility]  [address], [address] [place],  [address] Phone: [phone]  Fax: [phone]   Clinic Day:  09/29/20  Referring physician: Maryland Pink, MD  Chief Complaint: Kenneth Friedman. 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   HPI: The patient was last seen in the medical oncology clinic on 03/31/2020. At that time, he was doing well.  He denied any infections or bruising/bleeding.  Exam revealed no adenopathy or hepatosplenomegaly. Hematocrit was 43.2, hemoglobin 15.5, platelets 126,000, WBC 5,400. Bilirubin was 1.0.  We discussed continued surveillance.  He tested positive for COVID-19 on 05/03/2020. He received the casirivimab\imdevimab infusion on 05/06/2020.  During the interim, he has been "stressed" due to work. He notes that everything else is okay. He denies fevers, sweats, and weight loss. Since he had COVID-19 in 04/2020, he has been having frequent headaches and congestion. He also has year-round allergies.   His shoulders hurt when he is asleep and his lower back pain is constant. When he eats healthy and stretches, these pains go away, but he hasn't been doing that for a couple of months because he has been so busy.  He has right sided abdominal pain some days. His bowel movements are normal. He has a knot on his left foot that he thinks is because of a splinter.  He took creatinine for about a month but is not taking it anymore.   Past Medical History:  Diagnosis Date  . Allergy    seasonal  . Hairy cell leukemia ([place])   . Hairy cell leukemia ([place]) 02/09/2015    Past Surgical History:  Procedure Laterality Date  . CHOLECYSTECTOMY  12/15/14  . HERNIA REPAIR  2011  . INGUINAL HERNIA REPAIR    . ROTATOR CUFF REPAIR Right 2009, 2010    Family History  Problem Relation Age of Onset  . Hypertension Mother   . Hypertension Father   . Cancer Paternal Aunt   . Cancer  Paternal Grandmother   . Cancer Paternal Grandfather     Social History:  reports that he has never smoked. He has never used smokeless tobacco. He reports current alcohol use. He reports that he does not use drugs. 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 He works out at [employer] regularly. The patient is alone today.   Allergies:  Allergies  Allergen Reactions  . Fentanyl Other (See Comments)    Pt states " knocks me out for days, I cant handle it at all"  . Diclofenac Other (See Comments)    GI DISTRESS    Current Medications: Current Outpatient Medications  Medication Sig Dispense Refill  . hydrocortisone (ANUSOL-HC) 2.5 % rectal cream APPLY TOPICALLY TWICE DAILY AS NEEDED FOR HEMORRHOIDS    . ibuprofen (ADVIL,MOTRIN) 200 MG tablet Take 200 mg by mouth every 6 (six) hours as needed.    . Multiple Vitamin (MULTIVITAMIN) tablet Take 1 tablet by mouth daily.    Marland Kitchen aminocaproic acid (AMICAR) 25 % solution Take 1 g by mouth every hour. (Patient not taking: Reported on 09/29/2020)    . Omega-3 Fatty Acids (FISH OIL) 500 MG CAPS Take 500 capsules by mouth. (Patient not taking: Reported on 09/29/2020)    . tobramycin (TOBREX) 0.3 % ophthalmic solution SMARTSIG:1-2 Drop(s) Left Eye  Every 4 Hours (Patient not taking: Reported on 09/29/2020)     No current facility-administered medications for this visit.    Review of Systems  Constitutional: Negative for chills, diaphoresis, fever and malaise/fatigue.       Feels "ok".  HENT: Negative.  Negative for congestion, ear discharge, ear pain, hearing loss, nosebleeds, sinus pain, sore throat and tinnitus.   Eyes: Negative.  Negative for blurred vision, double vision and photophobia.  Respiratory: Negative.  Negative for cough, hemoptysis, sputum production and shortness of breath.   Cardiovascular: Negative.   Negative for chest pain, palpitations, orthopnea, leg swelling and PND.  Gastrointestinal: Positive for abdominal pain (right side, occasional). Negative for blood in stool, constipation, diarrhea, heartburn, melena, nausea and vomiting.  Genitourinary: Negative.  Negative for dysuria, frequency, hematuria and urgency.  Musculoskeletal: Positive for back pain and joint pain (shoulder). Negative for falls, myalgias and neck pain.  Skin: Negative.  Negative for itching and rash.  Neurological: Negative.  Negative for dizziness, sensory change, speech change, focal weakness, weakness and headaches.  Endo/Heme/Allergies: Negative.  Does not bruise/bleed easily.  Psychiatric/Behavioral: Negative for depression and memory loss. The patient is not nervous/anxious and does not have insomnia.        Stressed due to work.  All other systems reviewed and are negative.  Performance status (ECOG): 0  Vitals Blood pressure 133/81, pulse 85, temperature (!) 97 F (36.1 C), temperature source Tympanic, resp. rate 18, weight 252 lb (114.3 kg), SpO2 98 %.   Physical Exam Vitals and nursing note reviewed.  Constitutional:      General: He is not in acute distress.    Appearance: He is well-developed. He is not diaphoretic.  HENT:     Head: Normocephalic and atraumatic.     Comments: Alopecia.    Mouth/Throat:     Mouth: Mucous membranes are moist.     Pharynx: Oropharynx is clear.  Eyes:     General: No scleral icterus.    Extraocular Movements: Extraocular movements intact.     Conjunctiva/sclera: Conjunctivae normal.     Pupils: Pupils are equal, round, and reactive to light.  Cardiovascular:     Rate and Rhythm: Normal rate and regular rhythm.     Heart sounds: Normal heart sounds. No murmur heard.   Pulmonary:     Effort: No respiratory distress.     Breath sounds: Normal breath sounds. No wheezing or rales.  Chest:     Chest wall: No tenderness.  Breasts:     Right: No axillary  adenopathy or supraclavicular adenopathy.     Left: No axillary adenopathy or supraclavicular adenopathy.    Abdominal:     General: Bowel sounds are normal. There is no distension.     Palpations: Abdomen is soft. There is no hepatomegaly, splenomegaly or mass.     Tenderness: There is abdominal tenderness (right side). There is no guarding or rebound.  Musculoskeletal:        General: Normal range of motion.     Cervical back: Normal range of motion and neck supple.  Lymphadenopathy:     Head:     Right side of head: No preauricular, posterior auricular or occipital adenopathy.     Left side of head: No preauricular, posterior auricular or occipital adenopathy.     Cervical: No cervical adenopathy.     Upper Body:     Right upper body: No supraclavicular or axillary adenopathy.     Left upper body: No supraclavicular or  axillary adenopathy.     Lower Body: No right inguinal adenopathy. No left inguinal adenopathy.  Skin:    General: Skin is warm and dry.     Comments: Callused area at the base of his left second toe.  Neurological:     Mental Status: He is alert and oriented to person, place, and time.  Psychiatric:        Behavior: Behavior normal.        Thought Content: Thought content normal.        Judgment: Judgment normal.    Appointment on 09/29/2020  Component Date Value Ref Range Status  . Sodium 09/29/2020 139  135 - 145 mmol/L Final  . Potassium 09/29/2020 4.1  3.5 - 5.1 mmol/L Final  . Chloride 09/29/2020 104  98 - 111 mmol/L Final  . CO2 09/29/2020 23  22 - 32 mmol/L Final  . Glucose, Bld 09/29/2020 118* 70 - 99 mg/dL Final   Glucose reference range applies only to samples taken after fasting for at least 8 hours.  . BUN 09/29/2020 20  6 - 20 mg/dL Final  . Creatinine, Ser 09/29/2020 1.25* 0.61 - 1.24 mg/dL Final  . Calcium 83/15/1761 9.9  8.9 - 10.3 mg/dL Final  . Total Protein 09/29/2020 7.7  6.5 - 8.1 g/dL Final  . Albumin 60/73/7106 4.4  3.5 - 5.0 g/dL  Final  . AST 26/94/8546 24  15 - 41 U/L Final  . ALT 09/29/2020 31  0 - 44 U/L Final  . Alkaline Phosphatase 09/29/2020 62  38 - 126 U/L Final  . Total Bilirubin 09/29/2020 0.7  0.3 - 1.2 mg/dL Final  . GFR, Estimated 09/29/2020 >60  >60 mL/min Final   Comment: (NOTE) Calculated using the CKD-EPI Creatinine Equation (2021)   . Anion gap 09/29/2020 12  5 - 15 Final   Performed at [facility], [address], [place], [place] [address]  . WBC 09/29/2020 5.8  4.0 - 10.5 K/uL Final  . RBC 09/29/2020 5.55  4.22 - 5.81 MIL/uL Final  . Hemoglobin 09/29/2020 16.4  13.0 - 17.0 g/dL Final  . HCT 00/93/8182 47.0  39.0 - 52.0 % Final  . MCV 09/29/2020 84.7  80.0 - 100.0 fL Final  . MCH 09/29/2020 29.5  26.0 - 34.0 pg Final  . MCHC 09/29/2020 34.9  30.0 - 36.0 g/dL Final  . RDW 99/37/1696 12.7  11.5 - 15.5 % Final  . Platelets 09/29/2020 130* 150 - 400 K/uL Final  . nRBC 09/29/2020 0.0  0.0 - 0.2 % Final  . Neutrophils Relative % 09/29/2020 63  % Final  . Neutro Abs 09/29/2020 3.7  1.7 - 7.7 K/uL Final  . Lymphocytes Relative 09/29/2020 28  % Final  . Lymphs Abs 09/29/2020 1.6  0.7 - 4.0 K/uL Final  . Monocytes Relative 09/29/2020 6  % Final  . Monocytes Absolute 09/29/2020 0.3  0.1 - 1.0 K/uL Final  . Eosinophils Relative 09/29/2020 2  % Final  . Eosinophils Absolute 09/29/2020 0.1  0.0 - 0.5 K/uL Final  . Basophils Relative 09/29/2020 1  % Final  . Basophils Absolute 09/29/2020 0.0  0.0 - 0.1 K/uL Final  . Immature Granulocytes 09/29/2020 0  % Final  . Abs Immature Granulocytes 09/29/2020 0.02  0.00 - 0.07 K/uL Final   Performed at [facility], [address] [place], [place] [address]    Assessment:  Kenneth Imhoff. is a 45 y.o. male with hairy cell leukemia.  He presented with symptomatic splenomegaly and mild leukopenia and thrombocytopenia. He is 50months s/p cladribine(09/30/2014 - 10/07/2014). Counts are normal except for a slightly low platelet  count.   He has a history of severe abdominal pain. Abdominal and pelvic CT scan on 11/30/2014 revealed chronic cholelithiasis. He underwent cholecystectomy on 12/15/2014. Abdominal pain resolved after changing jobs.  He has a history of intermittentmild hyperbilirubinemia(1.1 - 1.9). 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  He tested positive for COVID-19 on 05/03/2020. He received the casirivimab\imdevimab infusion on 05/06/2020.  Symptomatically, he denies fevers, sweats, and weight loss. Since he had COVID-19, he has been having frequent headaches and congestion. He has year-round allergies.   Plan: 1.   Labs today: CBC with diff, CMP. 2.   Hairy cell leukemia Clinically, he is doing well.  Exam reveals no adenopathy or hepatosplenomegaly. Hematocrit 47.0.  Hemoglobin 16.4.  MCV 84.7.  Platelets 130,000.  WBC 5800 with an [place] of 3700. Discuss plan for ongoing surveillance every 6 months 3.   Left base of toe callus  Podiatry referral. 4.   RTC in 6 months for MD assessment and labs (CBC with diff, CMP).  I discussed the assessment and treatment plan with the patient.  The patient was provided an opportunity to ask questions and all were answered.  The patient agreed with the plan and demonstrated an understanding of the instructions.  The patient was advised to call back if the symptoms worsen or if the condition fails to improve as anticipated.  I provided 20 minutes of face-to-face time during this this encounter and > 50% was spent counseling as documented under my assessment and plan.    London Sheer Tufford, am acting as a Education administrator for Lequita Asal, MD.  I, [place] Mike Gip, MD, have reviewed the above documentation for accuracy and completeness, and I agree with the above.

## 2020-09-29 ENCOUNTER — Inpatient Hospital Stay: Payer: BC Managed Care – PPO

## 2020-09-29 ENCOUNTER — Other Ambulatory Visit: Payer: Self-pay

## 2020-09-29 ENCOUNTER — Encounter: Payer: Self-pay | Admitting: Hematology and Oncology

## 2020-09-29 ENCOUNTER — Inpatient Hospital Stay: Payer: BC Managed Care – PPO | Attending: Hematology and Oncology | Admitting: Hematology and Oncology

## 2020-09-29 VITALS — BP 133/81 | HR 85 | Temp 97.0°F | Resp 18 | Wt 255.5 lb

## 2020-09-29 DIAGNOSIS — R519 Headache, unspecified: Secondary | ICD-10-CM | POA: Diagnosis not present

## 2020-09-29 DIAGNOSIS — L989 Disorder of the skin and subcutaneous tissue, unspecified: Secondary | ICD-10-CM | POA: Diagnosis not present

## 2020-09-29 DIAGNOSIS — Z79899 Other long term (current) drug therapy: Secondary | ICD-10-CM | POA: Diagnosis not present

## 2020-09-29 DIAGNOSIS — Z9221 Personal history of antineoplastic chemotherapy: Secondary | ICD-10-CM | POA: Diagnosis not present

## 2020-09-29 DIAGNOSIS — Z8616 Personal history of COVID-19: Secondary | ICD-10-CM | POA: Diagnosis not present

## 2020-09-29 DIAGNOSIS — C9141 Hairy cell leukemia, in remission: Secondary | ICD-10-CM

## 2020-09-29 DIAGNOSIS — R0981 Nasal congestion: Secondary | ICD-10-CM | POA: Insufficient documentation

## 2020-09-29 DIAGNOSIS — R109 Unspecified abdominal pain: Secondary | ICD-10-CM | POA: Diagnosis not present

## 2020-09-29 DIAGNOSIS — C9140 Hairy cell leukemia not having achieved remission: Secondary | ICD-10-CM | POA: Insufficient documentation

## 2020-09-29 LAB — CBC WITH DIFFERENTIAL/PLATELET
Abs Immature Granulocytes: 0.02 10*3/uL (ref 0.00–0.07)
Basophils Absolute: 0 10*3/uL (ref 0.0–0.1)
Basophils Relative: 1 %
Eosinophils Absolute: 0.1 10*3/uL (ref 0.0–0.5)
Eosinophils Relative: 2 %
HCT: 47 % (ref 39.0–52.0)
Hemoglobin: 16.4 g/dL (ref 13.0–17.0)
Immature Granulocytes: 0 %
Lymphocytes Relative: 28 %
Lymphs Abs: 1.6 10*3/uL (ref 0.7–4.0)
MCH: 29.5 pg (ref 26.0–34.0)
MCHC: 34.9 g/dL (ref 30.0–36.0)
MCV: 84.7 fL (ref 80.0–100.0)
Monocytes Absolute: 0.3 10*3/uL (ref 0.1–1.0)
Monocytes Relative: 6 %
Neutro Abs: 3.7 10*3/uL (ref 1.7–7.7)
Neutrophils Relative %: 63 %
Platelets: 130 10*3/uL — ABNORMAL LOW (ref 150–400)
RBC: 5.55 MIL/uL (ref 4.22–5.81)
RDW: 12.7 % (ref 11.5–15.5)
WBC: 5.8 10*3/uL (ref 4.0–10.5)
nRBC: 0 % (ref 0.0–0.2)

## 2020-09-29 LAB — COMPREHENSIVE METABOLIC PANEL
ALT: 31 U/L (ref 0–44)
AST: 24 U/L (ref 15–41)
Albumin: 4.4 g/dL (ref 3.5–5.0)
Alkaline Phosphatase: 62 U/L (ref 38–126)
Anion gap: 12 (ref 5–15)
BUN: 20 mg/dL (ref 6–20)
CO2: 23 mmol/L (ref 22–32)
Calcium: 9.9 mg/dL (ref 8.9–10.3)
Chloride: 104 mmol/L (ref 98–111)
Creatinine, Ser: 1.25 mg/dL — ABNORMAL HIGH (ref 0.61–1.24)
GFR, Estimated: >60 mL/min (ref >60)
Glucose, Bld: 118 mg/dL — ABNORMAL HIGH (ref 70–99)
Potassium: 4.1 mmol/L (ref 3.5–5.1)
Sodium: 139 mmol/L (ref 135–145)
Total Bilirubin: 0.7 mg/dL (ref 0.3–1.2)
Total Protein: 7.7 g/dL (ref 6.5–8.1)

## 2020-09-29 NOTE — Progress Notes (Signed)
Patient here for oncology follow-up appointment, expresses  Concerns of chronic headaches, acid reflux and hard spot of foot

## 2020-09-30 ENCOUNTER — Telehealth: Payer: Self-pay

## 2020-09-30 NOTE — Telephone Encounter (Signed)
Fax was sent over to [facility] multiply times.

## 2020-10-14 ENCOUNTER — Ambulatory Visit: Payer: BC Managed Care – PPO | Admitting: Podiatry

## 2020-10-14 ENCOUNTER — Encounter: Payer: Self-pay | Admitting: Podiatry

## 2020-10-14 ENCOUNTER — Ambulatory Visit (INDEPENDENT_AMBULATORY_CARE_PROVIDER_SITE_OTHER): Payer: BC Managed Care – PPO

## 2020-10-14 ENCOUNTER — Other Ambulatory Visit: Payer: Self-pay

## 2020-10-14 DIAGNOSIS — Q6671 Congenital pes cavus, right foot: Secondary | ICD-10-CM | POA: Diagnosis not present

## 2020-10-14 DIAGNOSIS — Q667 Congenital pes cavus, unspecified foot: Secondary | ICD-10-CM | POA: Diagnosis not present

## 2020-10-14 DIAGNOSIS — Q6672 Congenital pes cavus, left foot: Secondary | ICD-10-CM

## 2020-10-14 DIAGNOSIS — M779 Enthesopathy, unspecified: Secondary | ICD-10-CM

## 2020-10-15 ENCOUNTER — Other Ambulatory Visit: Payer: Self-pay | Admitting: Podiatry

## 2020-10-15 ENCOUNTER — Encounter: Payer: Self-pay | Admitting: Podiatry

## 2020-10-15 DIAGNOSIS — Q6671 Congenital pes cavus, right foot: Secondary | ICD-10-CM

## 2020-10-15 DIAGNOSIS — Q6672 Congenital pes cavus, left foot: Secondary | ICD-10-CM

## 2020-10-15 NOTE — Progress Notes (Signed)
Subjective:  Patient ID: Kenneth Senter., male    DOB: 01-02-1976,  MRN: [id number]  Chief Complaint  Patient presents with  . Callouses  . Foot Pain    45 y.o. male presents with the above complaint.  Patient presents with a complaint of past cava/high arch foot structure.  Patient states that he walks pretty much on his feet pretty good amount of time and it started hurting especially in the ball of the foot.  He also gets some arch pain as well as pressure of his been on his foot for a long period of time.  He also developed this left submetatarsal 2 callus as well.  He denies any other acute complaints he has not seen anyone else prior to seeing me.  He would like to discuss orthotics.   Review of Systems: Negative except as noted in the HPI. Denies N/V/F/Ch.  Past Medical History:  Diagnosis Date  . Allergy    seasonal  . Hairy cell leukemia ([place])   . Hairy cell leukemia ([place]) 02/09/2015    Current Outpatient Medications:  .  Cholecalciferol (VITAMIN D3) 75 MCG (3000 UT) TABS, Take by mouth., Disp: , Rfl:  .  ELDERBERRY PO, Take by mouth., Disp: , Rfl:  .  hydrocortisone (ANUSOL-HC) 2.5 % rectal cream, APPLY TOPICALLY TWICE DAILY AS NEEDED FOR HEMORRHOIDS, Disp: , Rfl:  .  ibuprofen (ADVIL,MOTRIN) 200 MG tablet, Take 200 mg by mouth every 6 (six) hours as needed., Disp: , Rfl:  .  Multiple Vitamin (MULTIVITAMIN) tablet, Take 1 tablet by mouth daily., Disp: , Rfl:  .  Omega-3 Fatty Acids (FISH OIL) 500 MG CAPS, Take 500 capsules by mouth. (Patient not taking: Reported on 09/29/2020), Disp: , Rfl:  .  WHEY PROTEIN PO, Take by mouth., Disp: , Rfl:   Social History   Tobacco Use  Smoking Status Never Smoker  Smokeless Tobacco Never Used    Allergies  Allergen Reactions  . Fentanyl Other (See Comments)    Pt states " knocks me out for days, I cant handle it at all"  . Diclofenac Other (See Comments)    GI DISTRESS   Objective:  There were no vitals filed for this  visit. There is no height or weight on file to calculate BMI. Constitutional Well developed. Well nourished.  Vascular Dorsalis pedis pulses palpable bilaterally. Posterior tibial pulses palpable bilaterally. Capillary refill normal to all digits.  No cyanosis or clubbing noted. Pedal hair growth normal.  Neurologic Normal speech. Oriented to person, place, and time. Epicritic sensation to light touch grossly present bilaterally.  Dermatologic Nails well groomed and normal in appearance. No open wounds. No skin lesions.  Orthopedic:  Generalized pain across the arch.  No focal tenderness noted.  Gait examination shows high arch foot structure nonreducible with Coleman block test.  The apex of the deformity is at the midfoot.   Radiographs: 3 views of skeletally mature adult bilateral foot: There is increasing calcaneal inclination angle there is decreasing talar declination angle posterior break in the cyma line posterior plantar heel spurring noted.  No elevatus present.  No other bony abnormalities identified. Assessment:   1. Pes cavus    Plan:  Patient was evaluated and treated and all questions answered.  Pes cavus semirigid -I explained the patient the etiology of pes cavus foot structure and various treatment options were discussed.  Given the patient is having generalized arch pain to both of the lower extremity after a long period of  time he will benefit from custom-made orthotics to help support the arch of the foot control the hindfoot motion and take the stress away from the arch.  Patient agrees with the plan would like to proceed with getting orthotics. -He will be scheduled to see [facility] for custom-made orthotics  No follow-ups on file.

## 2020-11-10 ENCOUNTER — Ambulatory Visit (INDEPENDENT_AMBULATORY_CARE_PROVIDER_SITE_OTHER): Payer: BC Managed Care – PPO | Admitting: Orthotics

## 2020-11-10 ENCOUNTER — Other Ambulatory Visit: Payer: Self-pay

## 2020-11-10 DIAGNOSIS — Q6671 Congenital pes cavus, right foot: Secondary | ICD-10-CM

## 2020-11-10 DIAGNOSIS — Q6672 Congenital pes cavus, left foot: Secondary | ICD-10-CM | POA: Diagnosis not present

## 2020-11-10 DIAGNOSIS — Q667 Congenital pes cavus, unspecified foot: Secondary | ICD-10-CM

## 2020-11-10 NOTE — Progress Notes (Signed)
Patient came in today for evaluation/assessment custom foot orthotics.  Patient presents foot pain and discomfort associated with plantar fasciitis.  Patient has noted pes cavus foot type with also rear foot varus deformity. ...Goal is to "bring ground up" with contoured arch support, and 4 degree valgus RF and FF posting.     

## 2020-12-08 ENCOUNTER — Other Ambulatory Visit: Payer: Self-pay

## 2020-12-08 ENCOUNTER — Ambulatory Visit (INDEPENDENT_AMBULATORY_CARE_PROVIDER_SITE_OTHER): Payer: BC Managed Care – PPO | Admitting: Podiatry

## 2020-12-08 DIAGNOSIS — Q667 Congenital pes cavus, unspecified foot: Secondary | ICD-10-CM

## 2020-12-08 NOTE — Progress Notes (Signed)
Patient presents today for orthotic pick up. Patient voices no new complaints.  Orthotics were fitted to patient's feet. No discomfort and no rubbing. Patient satisfied with the orthotics.  Orthotics were dispensed to patient with instructions for break in wear and to call the office with any concerns or questions. 

## 2020-12-08 NOTE — Patient Instructions (Signed)

## 2020-12-22 ENCOUNTER — Encounter: Payer: BC Managed Care – PPO | Admitting: Orthotics

## 2021-04-08 ENCOUNTER — Other Ambulatory Visit: Payer: Self-pay | Admitting: *Deleted

## 2021-04-08 DIAGNOSIS — C9141 Hairy cell leukemia, in remission: Secondary | ICD-10-CM

## 2021-04-12 ENCOUNTER — Inpatient Hospital Stay: Payer: BC Managed Care – PPO | Attending: Internal Medicine

## 2021-04-12 ENCOUNTER — Ambulatory Visit: Payer: BC Managed Care – PPO | Admitting: Internal Medicine

## 2021-04-12 ENCOUNTER — Inpatient Hospital Stay: Payer: BC Managed Care – PPO | Admitting: Internal Medicine

## 2021-04-12 ENCOUNTER — Other Ambulatory Visit: Payer: Self-pay

## 2021-04-12 ENCOUNTER — Other Ambulatory Visit: Payer: BC Managed Care – PPO

## 2021-04-12 DIAGNOSIS — Z9221 Personal history of antineoplastic chemotherapy: Secondary | ICD-10-CM | POA: Insufficient documentation

## 2021-04-12 DIAGNOSIS — R5383 Other fatigue: Secondary | ICD-10-CM | POA: Insufficient documentation

## 2021-04-12 DIAGNOSIS — R030 Elevated blood-pressure reading, without diagnosis of hypertension: Secondary | ICD-10-CM | POA: Diagnosis not present

## 2021-04-12 DIAGNOSIS — C9141 Hairy cell leukemia, in remission: Secondary | ICD-10-CM

## 2021-04-12 DIAGNOSIS — Z856 Personal history of leukemia: Secondary | ICD-10-CM | POA: Insufficient documentation

## 2021-04-12 LAB — CBC WITH DIFFERENTIAL/PLATELET
Abs Immature Granulocytes: 0.01 10*3/uL (ref 0.00–0.07)
Basophils Absolute: 0.1 10*3/uL (ref 0.0–0.1)
Basophils Relative: 1 %
Eosinophils Absolute: 0.2 10*3/uL (ref 0.0–0.5)
Eosinophils Relative: 4 %
HCT: 46.2 % (ref 39.0–52.0)
Hemoglobin: 16.3 g/dL (ref 13.0–17.0)
Immature Granulocytes: 0 %
Lymphocytes Relative: 28 %
Lymphs Abs: 1.5 10*3/uL (ref 0.7–4.0)
MCH: 29.7 pg (ref 26.0–34.0)
MCHC: 35.3 g/dL (ref 30.0–36.0)
MCV: 84.3 fL (ref 80.0–100.0)
Monocytes Absolute: 0.4 10*3/uL (ref 0.1–1.0)
Monocytes Relative: 7 %
Neutro Abs: 3.1 10*3/uL (ref 1.7–7.7)
Neutrophils Relative %: 60 %
Platelets: 143 10*3/uL — ABNORMAL LOW (ref 150–400)
RBC: 5.48 MIL/uL (ref 4.22–5.81)
RDW: 13.1 % (ref 11.5–15.5)
WBC: 5.3 10*3/uL (ref 4.0–10.5)
nRBC: 0 % (ref 0.0–0.2)

## 2021-04-12 LAB — COMPREHENSIVE METABOLIC PANEL
ALT: 27 U/L (ref 0–44)
AST: 23 U/L (ref 15–41)
Albumin: 4.4 g/dL (ref 3.5–5.0)
Alkaline Phosphatase: 72 U/L (ref 38–126)
Anion gap: 8 (ref 5–15)
BUN: 16 mg/dL (ref 6–20)
CO2: 25 mmol/L (ref 22–32)
Calcium: 9.8 mg/dL (ref 8.9–10.3)
Chloride: 107 mmol/L (ref 98–111)
Creatinine, Ser: 1.08 mg/dL (ref 0.61–1.24)
GFR, Estimated: >60 mL/min (ref >60)
Glucose, Bld: 102 mg/dL — ABNORMAL HIGH (ref 70–99)
Potassium: 4.1 mmol/L (ref 3.5–5.1)
Sodium: 140 mmol/L (ref 135–145)
Total Bilirubin: 1.1 mg/dL (ref 0.3–1.2)
Total Protein: 7.9 g/dL (ref 6.5–8.1)

## 2021-04-12 NOTE — Progress Notes (Signed)
Has a place on foot he would like checked out. Has also had a tingling sensation in feet.

## 2021-04-12 NOTE — Progress Notes (Signed)
[facility] NOTE  Patient Care Team: Maryland Pink, MD as PCP - General (Family Medicine) Christene Lye, MD (General Surgery) Lequita Asal, MD as Referring Physician (Hematology and Oncology)  CHIEF COMPLAINTS/PURPOSE OF CONSULTATION:  Kenneth Friedman leukemia  #  Oncology History Overview Note  # hairy cell leukemia [dec 2015-2016].  He presented with symptomatic splenomegaly and mild leukopenia and thrombocytopenia.   s/p cladribine (09/30/2014 - 10/07/2014).     He has a history of severe abdominal pain. Abdominal and pelvic CT scan on 11/30/2014 revealed chronic cholelithiasis. He underwent cholecystectomy on 12/15/2014.   Abdominal pain resolved after changing jobs.   He has a history of intermittent mild hyperbilirubinemia (1.1 - 1.9).  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   Hairy cell leukemia ([place])  02/09/2015 Initial Diagnosis   Hairy cell leukemia ([facility])      HISTORY OF PRESENTING ILLNESS:  Kenneth Friedman. 45 y.o.  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  Patient denies any nausea vomiting.  Denies any headaches.  Complains of mild fatigue.  No recent infections.  No fevers.  No abdominal pain.  Concerned about elevated blood pressure.   Review of Systems  Constitutional:  Positive for malaise/fatigue. Negative for chills, diaphoresis, fever and weight loss.  HENT:  Negative for nosebleeds and sore throat.   Eyes:  Negative for double vision.  Respiratory:  Negative for cough, hemoptysis, sputum production, shortness of breath and wheezing.   Cardiovascular:  Negative for chest pain, palpitations, orthopnea and leg swelling.  Gastrointestinal:  Negative for abdominal pain, blood in stool, constipation, diarrhea, heartburn, melena, nausea and vomiting.  Genitourinary:  Negative for dysuria, frequency and urgency.  Musculoskeletal:  Negative for  back pain and joint pain.  Skin: Negative.  Negative for itching and rash.  Neurological:  Negative for dizziness, tingling, focal weakness, weakness and headaches.  Endo/Heme/Allergies:  Does not bruise/bleed easily.  Psychiatric/Behavioral:  Negative for depression. The patient is not nervous/anxious and does not have insomnia.     MEDICAL HISTORY:  Past Medical History:  Diagnosis Date   Allergy    seasonal   Hairy cell leukemia ([place])    Hairy cell leukemia ([place]) 02/09/2015    SURGICAL HISTORY: Past Surgical History:  Procedure Laterality Date   CHOLECYSTECTOMY  12/15/14   HERNIA REPAIR  2011   INGUINAL HERNIA REPAIR     ROTATOR CUFF REPAIR Right 2009, 2010    SOCIAL HISTORY: Social History   Socioeconomic History   Marital status: Married    Spouse name: Not on file   Number of children: Not on file   Years of education: Not on file   Highest education level: Not on file  Occupational History   Not on file  Tobacco Use   Smoking status: Never   Smokeless tobacco: Never  Substance and Sexual Activity   Alcohol use: Yes    Comment: rarely   Drug use: No   Sexual activity: Not on file  Other Topics Concern   Not on file  Social History Narrative   Not on file   Social Determinants of Health   Financial Resource Strain: Not on file  Food Insecurity: Not on file  Transportation Needs: Not on file  Physical Activity: Not on file  Stress: Not on file  Social Connections: Not on file  Intimate Partner Violence: Not on file    FAMILY HISTORY: Family History  Problem Relation Age of Onset   Hypertension Mother    Hypertension Father    Cancer Paternal Aunt    Cancer Paternal Grandmother    Cancer Paternal Grandfather     ALLERGIES:  is allergic to fentanyl and diclofenac.  MEDICATIONS:  Current Outpatient Medications  Medication Sig Dispense Refill   ascorbic acid (VITAMIN C) 1000 MG tablet Take by mouth.     Cholecalciferol (VITAMIN D3) 75 MCG (3000  UT) TABS Take by mouth.     Cholecalciferol 25 MCG (1000 UT) tablet Take by mouth.     Coenzyme Q10 10 MG capsule Take by mouth.     ELDERBERRY PO Take by mouth.     hydrocortisone (ANUSOL-HC) 2.5 % rectal cream APPLY TOPICALLY TWICE DAILY AS NEEDED FOR HEMORRHOIDS     ibuprofen (ADVIL,MOTRIN) 200 MG tablet Take 200 mg by mouth every 6 (six) hours as needed.     Multiple Vitamin (MULTIVITAMIN) tablet Take 1 tablet by mouth daily.     WHEY PROTEIN PO Take by mouth. (Patient not taking: Reported on 04/12/2021)     No current facility-administered medications for this visit.      Marland Kitchen  PHYSICAL EXAMINATION: ECOG PERFORMANCE STATUS: 0 - Asymptomatic  Vitals:   04/12/21 1116  BP: (!) 120/91  Pulse: 72  Resp: 16  Temp: (!) 96.8 F (36 C)  SpO2: 98%   Filed Weights   04/12/21 1116  Weight: 257 lb 15 oz (117 kg)    Physical Exam Vitals and nursing note reviewed.  Constitutional:      Comments: Patient resting in the bed.   Alone    HENT:     Head: Normocephalic and atraumatic.     Mouth/Throat:     Mouth: Mucous membranes are moist.     Pharynx: Oropharynx is clear. No oropharyngeal exudate.  Eyes:     Extraocular Movements: Extraocular movements intact.     Pupils: Pupils are equal, round, and reactive to light.  Cardiovascular:     Rate and Rhythm: Normal rate and regular rhythm.  Pulmonary:     Effort: Pulmonary effort is normal. No respiratory distress.     Breath sounds: Normal breath sounds. No wheezing.  Abdominal:     General: Bowel sounds are normal. There is no distension.     Palpations: Abdomen is soft. There is no mass.     Tenderness: There is no abdominal tenderness. There is no guarding or rebound.  Musculoskeletal:        General: No tenderness. Normal range of motion.     Cervical back: Normal range of motion and neck supple.  Skin:    General: Skin is warm.  Neurological:     General: No focal deficit present.     Mental Status: He is alert and  oriented to person, place, and time.  Psychiatric:        Mood and Affect: Affect normal.        Behavior: Behavior normal.        Judgment: Judgment normal.    LABORATORY DATA:  I have reviewed the data as listed Lab Results  Component Value Date   WBC 5.3 04/12/2021   HGB 16.3 04/12/2021   HCT 46.2 04/12/2021   MCV 84.3 04/12/2021   PLT 143 (L) 04/12/2021   Recent Labs    09/29/20 1005 04/12/21 1105  NA 139 140  K 4.1 4.1  CL 104 107  CO2 23 25  GLUCOSE 118* 102*  BUN  20 16  CREATININE 1.25* 1.08  CALCIUM 9.9 9.8  GFRNONAA >60 >60  PROT 7.7 7.9  ALBUMIN 4.4 4.4  AST 24 23  ALT 31 27  ALKPHOS 62 72  BILITOT 0.7 1.1    RADIOGRAPHIC STUDIES: I have personally reviewed the radiological images as listed and agreed with the findings in the report. No results found.  ASSESSMENT & PLAN:   Hairy cell leukemia ([place]) #  Hairy cell leukemia [2015dec]-s/p cladribine [finished Jan 2016]; clinically is doing well without any evidence of infection/blood counts are normal except for mildly low platelets in 140s.    #Again discussed the natural history of his leukemia that its not curable; however can go to long periods of remission.  And they have been excellent new treatment options available for recurrent hairy cell leukemia.  Currently continue surveillance.  #Diastolic blood pressure elevated at 90-systolic in 295A-213Y; recommend decrease carbs/increasing protein; also exercise/weight training [patient is a previous personal trainer].  Counseled regarding weight loss-Mediterranean diet [italian origins]  #Bilateral tingling in the big toe-no obvious etiology noted; monitor for now  #Gilbert disease-bilirubin stable..  # DISPOSITION: # follow up in 6 months- MD; labs- cbc/cmp/LDH-Dr.B      All questions were answered. The patient knows to call the clinic with any problems, questions or concerns.       Cammie Sickle, MD 04/12/2021 1:12 PM

## 2021-04-12 NOTE — Assessment & Plan Note (Addendum)
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    #Again discussed the natural history of his leukemia that its not curable; however can go to long periods of remission.  And they have been excellent new treatment options available for recurrent hairy cell leukemia.  Currently continue surveillance.  #Diastolic blood pressure elevated at 90-systolic in 767H-419F; recommend decrease carbs/increasing protein; also exercise/weight training [patient is a previous personal trainer].  Counseled regarding weight loss-Mediterranean diet [italian origins]  #Bilateral tingling in the big toe-no obvious etiology noted; monitor for now  #Gilbert disease-bilirubin stable..  # DISPOSITION: # follow up in 6 months- MD; labs- cbc/cmp/LDH-Dr.B

## 2021-10-06 ENCOUNTER — Other Ambulatory Visit: Payer: Self-pay | Admitting: *Deleted

## 2021-10-06 DIAGNOSIS — C9141 Hairy cell leukemia, in remission: Secondary | ICD-10-CM

## 2021-10-11 ENCOUNTER — Encounter: Payer: Self-pay | Admitting: Internal Medicine

## 2021-10-11 ENCOUNTER — Other Ambulatory Visit: Payer: Self-pay

## 2021-10-11 ENCOUNTER — Inpatient Hospital Stay: Payer: BC Managed Care – PPO | Admitting: Internal Medicine

## 2021-10-11 ENCOUNTER — Inpatient Hospital Stay: Payer: BC Managed Care – PPO | Attending: Internal Medicine

## 2021-10-11 VITALS — BP 132/90 | HR 73 | Temp 95.6°F | Wt 255.2 lb

## 2021-10-11 DIAGNOSIS — M546 Pain in thoracic spine: Secondary | ICD-10-CM | POA: Insufficient documentation

## 2021-10-11 DIAGNOSIS — C9141 Hairy cell leukemia, in remission: Secondary | ICD-10-CM | POA: Diagnosis present

## 2021-10-11 LAB — COMPREHENSIVE METABOLIC PANEL
ALT: 29 U/L (ref 0–44)
AST: 20 U/L (ref 15–41)
Albumin: 4.8 g/dL (ref 3.5–5.0)
Alkaline Phosphatase: 67 U/L (ref 38–126)
Anion gap: 9 (ref 5–15)
BUN: 20 mg/dL (ref 6–20)
CO2: 24 mmol/L (ref 22–32)
Calcium: 9.6 mg/dL (ref 8.9–10.3)
Chloride: 103 mmol/L (ref 98–111)
Creatinine, Ser: 1.13 mg/dL (ref 0.61–1.24)
GFR, Estimated: >60 mL/min (ref >60)
Glucose, Bld: 100 mg/dL — ABNORMAL HIGH (ref 70–99)
Potassium: 4 mmol/L (ref 3.5–5.1)
Sodium: 136 mmol/L (ref 135–145)
Total Bilirubin: 1.6 mg/dL — ABNORMAL HIGH (ref 0.3–1.2)
Total Protein: 7.9 g/dL (ref 6.5–8.1)

## 2021-10-11 LAB — LACTATE DEHYDROGENASE: LDH: 118 U/L (ref 98–192)

## 2021-10-11 LAB — CBC WITH DIFFERENTIAL/PLATELET
Abs Immature Granulocytes: 0 10*3/uL (ref 0.00–0.07)
Basophils Absolute: 0 10*3/uL (ref 0.0–0.1)
Basophils Relative: 1 %
Eosinophils Absolute: 0.1 10*3/uL (ref 0.0–0.5)
Eosinophils Relative: 2 %
HCT: 46.4 % (ref 39.0–52.0)
Hemoglobin: 16.6 g/dL (ref 13.0–17.0)
Immature Granulocytes: 0 %
Lymphocytes Relative: 29 %
Lymphs Abs: 1.5 10*3/uL (ref 0.7–4.0)
MCH: 30.1 pg (ref 26.0–34.0)
MCHC: 35.8 g/dL (ref 30.0–36.0)
MCV: 84.1 fL (ref 80.0–100.0)
Monocytes Absolute: 0.3 10*3/uL (ref 0.1–1.0)
Monocytes Relative: 7 %
Neutro Abs: 3.1 10*3/uL (ref 1.7–7.7)
Neutrophils Relative %: 61 %
Platelets: 141 10*3/uL — ABNORMAL LOW (ref 150–400)
RBC: 5.52 MIL/uL (ref 4.22–5.81)
RDW: 12.8 % (ref 11.5–15.5)
WBC: 5.1 10*3/uL (ref 4.0–10.5)
nRBC: 0 % (ref 0.0–0.2)

## 2021-10-11 NOTE — Progress Notes (Signed)
Pt states he has had some chest tightness that comes and goes. Thinks it may be stress.   Having some tingling in his toes.

## 2021-10-11 NOTE — Assessment & Plan Note (Addendum)
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  Continue surveillance.  # Mid back pain/hip  X 2-3 months-appears [facility]-follow-up imaging.  Recommend yoga/core exercises.  NSAIDs as needed for pain control.  #Diastolic blood pressure elevated at 90-systolic in 919T-660A; again recommend weight loss exercise.  #Gilbert disease-bilirubin stable.  # DISPOSITION: # follow up in 6 months- MD; labs- cbc/cmp/LDH-Dr.B

## 2021-10-11 NOTE — Progress Notes (Signed)
[facility] NOTE  Patient Care Team: Maryland Pink, MD as PCP - General (Family Medicine) Christene Lye, MD (General Surgery) Lequita Asal, MD (Inactive) as Referring Physician (Hematology and Oncology)  CHIEF COMPLAINTS/PURPOSE OF CONSULTATION:  Aline Brochure leukemia  #  Oncology History Overview Note  # hairy cell leukemia [dec 2015-2016].  He presented with symptomatic splenomegaly and mild leukopenia and thrombocytopenia.   s/p cladribine (09/30/2014 - 10/07/2014).     He has a history of severe abdominal pain. Abdominal and pelvic CT scan on 11/30/2014 revealed chronic cholelithiasis. He underwent cholecystectomy on 12/15/2014.   Abdominal pain resolved after changing jobs.   He has a history of intermittent mild hyperbilirubinemia (1.1 - 1.9).  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   Hairy cell leukemia ([place])  02/09/2015 Initial Diagnosis   Hairy cell leukemia ([facility])      HISTORY OF PRESENTING ILLNESS: Alone.  Ambulating independently. Kenneth Friedman. 46 y.o.  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  Complains of tightness in his bilateral hips; also tightness in his back.  Denies any worsening pain.  Complains of random tingling/sharp pain in his toes-right; history of herniated disc.  Admits to poor sleeping habits; stress at work.  Otherwise no fever no chills.   Review of Systems  Constitutional:  Positive for malaise/fatigue. Negative for chills, diaphoresis, fever and weight loss.  HENT:  Negative for nosebleeds and sore throat.   Eyes:  Negative for double vision.  Respiratory:  Negative for cough, hemoptysis, sputum production, shortness of breath and wheezing.   Cardiovascular:  Negative for chest pain, palpitations, orthopnea and leg swelling.  Gastrointestinal:  Negative for abdominal pain, blood in stool, constipation, diarrhea,  heartburn, melena, nausea and vomiting.  Genitourinary:  Negative for dysuria, frequency and urgency.  Musculoskeletal:  Negative for back pain and joint pain.  Skin: Negative.  Negative for itching and rash.  Neurological:  Negative for dizziness, tingling, focal weakness, weakness and headaches.  Endo/Heme/Allergies:  Does not bruise/bleed easily.  Psychiatric/Behavioral:  Negative for depression. The patient is not nervous/anxious and does not have insomnia.     MEDICAL HISTORY:  Past Medical History:  Diagnosis Date   Allergy    seasonal   Hairy cell leukemia ([place])    Hairy cell leukemia ([place]) 02/09/2015    SURGICAL HISTORY: Past Surgical History:  Procedure Laterality Date   CHOLECYSTECTOMY  12/15/14   HERNIA REPAIR  2011   INGUINAL HERNIA REPAIR     ROTATOR CUFF REPAIR Right 2009, 2010    SOCIAL HISTORY: Social History   Socioeconomic History   Marital status: Married    Spouse name: Not on file   Number of children: Not on file   Years of education: Not on file   Highest education level: Not on file  Occupational History   Not on file  Tobacco Use   Smoking status: Never   Smokeless tobacco: Never  Vaping Use   Vaping Use: Never used  Substance and Sexual Activity   Alcohol use: Not Currently   Drug use: No   Sexual activity: Not on file  Other Topics Concern   Not on file  Social History Narrative   Not on file   Social Determinants of Health   Financial Resource Strain: Not on file  Food Insecurity: Not on file  Transportation Needs: Not on file  Physical Activity: Not on file  Stress: Not  on file  Social Connections: Not on file  Intimate Partner Violence: Not on file    FAMILY HISTORY: Family History  Problem Relation Age of Onset   Hypertension Mother    Hypertension Father    Cancer Paternal Aunt    Cancer Paternal Grandmother    Cancer Paternal Grandfather     ALLERGIES:  is allergic to fentanyl and diclofenac.  MEDICATIONS:   Current Outpatient Medications  Medication Sig Dispense Refill   ascorbic acid (VITAMIN C) 1000 MG tablet Take by mouth.     cholecalciferol (VITAMIN D3) 25 MCG (1000 UNIT) tablet      Coenzyme Q10 10 MG capsule Take by mouth.     Cyanocobalamin (VITAMIN B12) 1000 MCG TBCR      ELDERBERRY PO Take by mouth.     hydrocortisone (ANUSOL-HC) 2.5 % rectal cream APPLY TOPICALLY TWICE DAILY AS NEEDED FOR HEMORRHOIDS     ibuprofen (ADVIL,MOTRIN) 200 MG tablet Take 200 mg by mouth every 6 (six) hours as needed.     Multiple Vitamin (MULTIVITAMIN) tablet Take 1 tablet by mouth daily.     Zinc 50 MG TABS      Cholecalciferol (VITAMIN D3) 75 MCG (3000 UT) TABS Take by mouth.     Cholecalciferol 25 MCG (1000 UT) tablet Take by mouth.     WHEY PROTEIN PO Take by mouth. (Patient not taking: Reported on 04/12/2021)     No current facility-administered medications for this visit.      Marland Kitchen  PHYSICAL EXAMINATION: ECOG PERFORMANCE STATUS: 0 - Asymptomatic  Vitals:   10/11/21 1033  BP: 132/90  Pulse: 73  Temp: (!) 95.6 F (35.3 C)  SpO2: 99%   Filed Weights   10/11/21 1033  Weight: 255 lb 3.2 oz (115.8 kg)    Physical Exam Vitals and nursing note reviewed.  Constitutional:      Comments:  Alone    HENT:     Head: Normocephalic and atraumatic.     Mouth/Throat:     Mouth: Mucous membranes are moist.     Pharynx: Oropharynx is clear. No oropharyngeal exudate.  Eyes:     Extraocular Movements: Extraocular movements intact.     Pupils: Pupils are equal, round, and reactive to light.  Cardiovascular:     Rate and Rhythm: Normal rate and regular rhythm.  Pulmonary:     Effort: Pulmonary effort is normal. No respiratory distress.     Breath sounds: Normal breath sounds. No wheezing.  Abdominal:     General: Bowel sounds are normal. There is no distension.     Palpations: Abdomen is soft. There is no mass.     Tenderness: There is no abdominal tenderness. There is no guarding or rebound.   Musculoskeletal:        General: No tenderness. Normal range of motion.     Cervical back: Normal range of motion and neck supple.  Skin:    General: Skin is warm.  Neurological:     General: No focal deficit present.     Mental Status: He is alert and oriented to person, place, and time.  Psychiatric:        Mood and Affect: Affect normal.        Behavior: Behavior normal.        Judgment: Judgment normal.    LABORATORY DATA:  I have reviewed the data as listed Lab Results  Component Value Date   WBC 5.1 10/11/2021   HGB 16.6 10/11/2021   HCT  46.4 10/11/2021   MCV 84.1 10/11/2021   PLT 141 (L) 10/11/2021   Recent Labs    04/12/21 1105 10/11/21 1021  NA 140 136  K 4.1 4.0  CL 107 103  CO2 25 24  GLUCOSE 102* 100*  BUN 16 20  CREATININE 1.08 1.13  CALCIUM 9.8 9.6  GFRNONAA >60 >60  PROT 7.9 7.9  ALBUMIN 4.4 4.8  AST 23 20  ALT 27 29  ALKPHOS 72 67  BILITOT 1.1 1.6*    RADIOGRAPHIC STUDIES: I have personally reviewed the radiological images as listed and agreed with the findings in the report. No results found.  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  Continue surveillance.  # Mid back pain/hip  X 2-3 months-appears [facility]-follow-up imaging.  Recommend yoga/core exercises.  NSAIDs as needed for pain control.  #Diastolic blood pressure elevated at 90-systolic in 583E-940H; again recommend weight loss exercise.  #Gilbert disease-bilirubin stable.  # DISPOSITION: # follow up in 6 months- MD; labs- cbc/cmp/LDH-Dr.B       All questions were answered. The patient knows to call the clinic with any problems, questions or concerns.       Cammie Sickle, MD 10/11/2021 11:05 AM

## 2022-04-10 ENCOUNTER — Encounter: Payer: Self-pay | Admitting: Oncology

## 2022-04-10 ENCOUNTER — Inpatient Hospital Stay (HOSPITAL_BASED_OUTPATIENT_CLINIC_OR_DEPARTMENT_OTHER): Payer: BC Managed Care – PPO | Admitting: Oncology

## 2022-04-10 ENCOUNTER — Inpatient Hospital Stay: Payer: BC Managed Care – PPO | Attending: Oncology

## 2022-04-10 ENCOUNTER — Ambulatory Visit: Payer: BC Managed Care – PPO | Admitting: Internal Medicine

## 2022-04-10 VITALS — BP 136/81 | HR 75 | Temp 98.7°F | Resp 20 | Wt 260.3 lb

## 2022-04-10 DIAGNOSIS — C9141 Hairy cell leukemia, in remission: Secondary | ICD-10-CM | POA: Insufficient documentation

## 2022-04-10 DIAGNOSIS — M545 Low back pain, unspecified: Secondary | ICD-10-CM | POA: Insufficient documentation

## 2022-04-10 DIAGNOSIS — R7989 Other specified abnormal findings of blood chemistry: Secondary | ICD-10-CM | POA: Diagnosis not present

## 2022-04-10 LAB — COMPREHENSIVE METABOLIC PANEL
ALT: 22 U/L (ref 0–44)
AST: 20 U/L (ref 15–41)
Albumin: 4.3 g/dL (ref 3.5–5.0)
Alkaline Phosphatase: 64 U/L (ref 38–126)
Anion gap: 6 (ref 5–15)
BUN: 21 mg/dL — ABNORMAL HIGH (ref 6–20)
CO2: 24 mmol/L (ref 22–32)
Calcium: 9.1 mg/dL (ref 8.9–10.3)
Chloride: 109 mmol/L (ref 98–111)
Creatinine, Ser: 1.36 mg/dL — ABNORMAL HIGH (ref 0.61–1.24)
GFR, Estimated: >60 mL/min (ref >60)
Glucose, Bld: 104 mg/dL — ABNORMAL HIGH (ref 70–99)
Potassium: 4 mmol/L (ref 3.5–5.1)
Sodium: 139 mmol/L (ref 135–145)
Total Bilirubin: 1.1 mg/dL (ref 0.3–1.2)
Total Protein: 7.3 g/dL (ref 6.5–8.1)

## 2022-04-10 LAB — CBC WITH DIFFERENTIAL/PLATELET
Abs Immature Granulocytes: 0.01 10*3/uL (ref 0.00–0.07)
Basophils Absolute: 0 10*3/uL (ref 0.0–0.1)
Basophils Relative: 1 %
Eosinophils Absolute: 0.1 10*3/uL (ref 0.0–0.5)
Eosinophils Relative: 1 %
HCT: 44.9 % (ref 39.0–52.0)
Hemoglobin: 15.9 g/dL (ref 13.0–17.0)
Immature Granulocytes: 0 %
Lymphocytes Relative: 26 %
Lymphs Abs: 1.2 10*3/uL (ref 0.7–4.0)
MCH: 30.4 pg (ref 26.0–34.0)
MCHC: 35.4 g/dL (ref 30.0–36.0)
MCV: 85.9 fL (ref 80.0–100.0)
Monocytes Absolute: 0.3 10*3/uL (ref 0.1–1.0)
Monocytes Relative: 7 %
Neutro Abs: 3 10*3/uL (ref 1.7–7.7)
Neutrophils Relative %: 65 %
Platelets: 123 10*3/uL — ABNORMAL LOW (ref 150–400)
RBC: 5.23 MIL/uL (ref 4.22–5.81)
RDW: 13.3 % (ref 11.5–15.5)
WBC: 4.7 10*3/uL (ref 4.0–10.5)
nRBC: 0 % (ref 0.0–0.2)

## 2022-04-10 LAB — LACTATE DEHYDROGENASE: LDH: 117 U/L (ref 98–192)

## 2022-04-10 NOTE — Progress Notes (Signed)
[facility] NOTE  Patient Care Team: Maryland Pink, MD as PCP - General (Family Medicine) Christene Lye, MD (General Surgery) Lequita Asal, MD (Inactive) as Referring Physician (Hematology and Oncology)  CHIEF COMPLAINTS/PURPOSE OF CONSULTATION:  Kenneth Friedman leukemia  #  Oncology History Overview Note  # hairy cell leukemia [dec 2015-2016].  He presented with symptomatic splenomegaly and mild leukopenia and thrombocytopenia.   s/p cladribine (09/30/2014 - 10/07/2014).     He has a history of severe abdominal pain. Abdominal and pelvic CT scan on 11/30/2014 revealed chronic cholelithiasis. He underwent cholecystectomy on 12/15/2014.   Abdominal pain resolved after changing jobs.   He has a history of intermittent mild hyperbilirubinemia (1.1 - 1.9).  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   Hairy cell leukemia ([place])  02/09/2015 Initial Diagnosis   Hairy cell leukemia ([place])      HISTORY OF PRESENTING ILLNESS:  Patient is here for follow-up for hairy cell leukemia.  Overall is doing well.  Notes some weight gain.  Has been actively trying to lose weight.  Having some low back pain and is being followed by Dr. Kary Kos at [facility].  Currently feels its slightly improving.  No other new concerns.  Review of Systems  Musculoskeletal:  Positive for back pain.  All other systems reviewed and are negative.    MEDICAL HISTORY:  Past Medical History:  Diagnosis Date   Allergy    seasonal   Hairy cell leukemia ([place])    Hairy cell leukemia ([place]) 02/09/2015    SURGICAL HISTORY: Past Surgical History:  Procedure Laterality Date   CHOLECYSTECTOMY  12/15/14   HERNIA REPAIR  2011   INGUINAL HERNIA REPAIR     ROTATOR CUFF REPAIR Right 2009, 2010    SOCIAL HISTORY: Social History   Socioeconomic History   Marital status: Married    Spouse name: Not on file   Number of children: Not on file   Years of  education: Not on file   Highest education level: Not on file  Occupational History   Not on file  Tobacco Use   Smoking status: Never   Smokeless tobacco: Never  Vaping Use   Vaping Use: Never used  Substance and Sexual Activity   Alcohol use: Not Currently   Drug use: No   Sexual activity: Not on file  Other Topics Concern   Not on file  Social History Narrative   Not on file   Social Determinants of Health   Financial Resource Strain: Not on file  Food Insecurity: Not on file  Transportation Needs: Not on file  Physical Activity: Not on file  Stress: Not on file  Social Connections: Not on file  Intimate Partner Violence: Not on file    FAMILY HISTORY: Family History  Problem Relation Age of Onset   Hypertension Mother    Hypertension Father    Cancer Paternal Aunt    Cancer Paternal Grandmother    Cancer Paternal Grandfather     ALLERGIES:  is allergic to fentanyl and diclofenac.  MEDICATIONS:  Current Outpatient Medications  Medication Sig Dispense Refill   ascorbic acid (VITAMIN C) 1000 MG tablet Take by mouth.     cholecalciferol (VITAMIN D3) 25 MCG (1000 UNIT) tablet      Coenzyme Q10 10 MG capsule Take by mouth.     Cyanocobalamin (VITAMIN B12) 1000 MCG TBCR      ELDERBERRY PO Take by mouth.     hydrocortisone (ANUSOL-HC) 2.5 %  rectal cream APPLY TOPICALLY TWICE DAILY AS NEEDED FOR HEMORRHOIDS     ibuprofen (ADVIL,MOTRIN) 200 MG tablet Take 200 mg by mouth every 6 (six) hours as needed.     Multiple Vitamin (MULTIVITAMIN) tablet Take 1 tablet by mouth daily.     Zinc 50 MG TABS      Cholecalciferol (VITAMIN D3) 75 MCG (3000 UT) TABS Take by mouth.     Cholecalciferol 25 MCG (1000 UT) tablet Take by mouth.     WHEY PROTEIN PO Take by mouth. (Patient not taking: Reported on 04/12/2021)     No current facility-administered medications for this visit.    PHYSICAL EXAMINATION: ECOG PERFORMANCE STATUS: 0 - Asymptomatic  Vitals:   04/10/22 1018  BP:  136/81  Pulse: 75  Resp: 20  Temp: 98.7 F (37.1 C)  SpO2: 98%   Filed Weights   04/10/22 1018  Weight: 260 lb 4.8 oz (118.1 kg)    Physical Exam Constitutional:      Appearance: Normal appearance.  Cardiovascular:     Rate and Rhythm: Normal rate.  Pulmonary:     Effort: Pulmonary effort is normal.     Breath sounds: Normal breath sounds.  Abdominal:     General: Abdomen is flat.     Palpations: Abdomen is soft.  Lymphadenopathy:     Head:     Right side of head: No submandibular or tonsillar adenopathy.     Left side of head: No submandibular or tonsillar adenopathy.     Cervical: No cervical adenopathy.     Upper Body:     Right upper body: No supraclavicular or axillary adenopathy.     Left upper body: No supraclavicular or axillary adenopathy.     Comments: No lymphnodes  Neurological:     Mental Status: He is alert and oriented to person, place, and time.     LABORATORY DATA:  I have reviewed the data as listed Lab Results  Component Value Date   WBC 4.7 04/10/2022   HGB 15.9 04/10/2022   HCT 44.9 04/10/2022   MCV 85.9 04/10/2022   PLT 123 (L) 04/10/2022   Recent Labs    04/12/21 1105 10/11/21 1021  NA 140 136  K 4.1 4.0  CL 107 103  CO2 25 24  GLUCOSE 102* 100*  BUN 16 20  CREATININE 1.08 1.13  CALCIUM 9.8 9.6  GFRNONAA >60 >60  PROT 7.9 7.9  ALBUMIN 4.4 4.8  AST 23 20  ALT 27 29  ALKPHOS 72 67  BILITOT 1.1 1.6*     RADIOGRAPHIC STUDIES: I have personally reviewed the radiological images as listed and agreed with the findings in the report. No results found.  ASSESSMENT & PLAN:   Hairy cell leukemia-completed treatment in January 2016 with cladribine.  Blood counts remain stable with slightly low platelet counts.  Platelet count today is 123.  Continue surveillance at this time.  Low back pain-followed by Dr. Gorden Harms at [facility].  Weight gain-discussed with PCP.  Patient starting to workout several times per week.  Has not  been super consistent but is trying.   Gilbert's disease-bilirubin stable.    Elevated creatinine-continue to monitor.  Discussed drinking more water.  Avoid nephrotoxic medications.  Disposition -return to clinic in 6 months with lab work (CBC, CMP and LDH) and follow-up with Dr. Rogue Bussing.    I spent 25 minutes dedicated to the care of this patient (face-to-face and non-face-to-face) on the date of the encounter to include what  is described in the assessment and plan.  All questions were answered. The patient knows to call the clinic with any problems, questions or concerns.    Jacquelin Hawking, NP 04/10/2022 10:27 AM

## 2022-08-28 ENCOUNTER — Telehealth: Payer: Self-pay | Admitting: *Deleted

## 2022-08-28 NOTE — Telephone Encounter (Signed)
-----   Message from Stephens November sent at 08/28/2022 10:17 AM EST ----- Regarding: Pt appt req Pt called in, states that he is having same symptoms that he's experienced before, fatigue abd pain,etc. He wants to know if he can have labs done to see if anything if wrong. Please advise

## 2022-08-30 ENCOUNTER — Other Ambulatory Visit: Payer: Self-pay | Admitting: *Deleted

## 2022-08-30 DIAGNOSIS — C9141 Hairy cell leukemia, in remission: Secondary | ICD-10-CM

## 2022-08-31 ENCOUNTER — Encounter: Payer: Self-pay | Admitting: Internal Medicine

## 2022-08-31 ENCOUNTER — Inpatient Hospital Stay: Payer: BC Managed Care – PPO | Attending: Internal Medicine

## 2022-08-31 ENCOUNTER — Inpatient Hospital Stay: Payer: BC Managed Care – PPO | Admitting: Internal Medicine

## 2022-08-31 VITALS — BP 119/83 | HR 62 | Temp 96.0°F | Resp 18 | Wt 257.0 lb

## 2022-08-31 DIAGNOSIS — R200 Anesthesia of skin: Secondary | ICD-10-CM | POA: Diagnosis not present

## 2022-08-31 DIAGNOSIS — D696 Thrombocytopenia, unspecified: Secondary | ICD-10-CM | POA: Insufficient documentation

## 2022-08-31 DIAGNOSIS — Z79899 Other long term (current) drug therapy: Secondary | ICD-10-CM | POA: Diagnosis not present

## 2022-08-31 DIAGNOSIS — M546 Pain in thoracic spine: Secondary | ICD-10-CM | POA: Diagnosis not present

## 2022-08-31 DIAGNOSIS — C9140 Hairy cell leukemia not having achieved remission: Secondary | ICD-10-CM | POA: Diagnosis present

## 2022-08-31 DIAGNOSIS — C9141 Hairy cell leukemia, in remission: Secondary | ICD-10-CM

## 2022-08-31 DIAGNOSIS — R202 Paresthesia of skin: Secondary | ICD-10-CM | POA: Insufficient documentation

## 2022-08-31 DIAGNOSIS — G8929 Other chronic pain: Secondary | ICD-10-CM | POA: Diagnosis not present

## 2022-08-31 LAB — COMPREHENSIVE METABOLIC PANEL
ALT: 22 U/L (ref 0–44)
AST: 17 U/L (ref 15–41)
Albumin: 4.4 g/dL (ref 3.5–5.0)
Alkaline Phosphatase: 72 U/L (ref 38–126)
Anion gap: 7 (ref 5–15)
BUN: 19 mg/dL (ref 6–20)
CO2: 24 mmol/L (ref 22–32)
Calcium: 9.3 mg/dL (ref 8.9–10.3)
Chloride: 107 mmol/L (ref 98–111)
Creatinine, Ser: 1.18 mg/dL (ref 0.61–1.24)
GFR, Estimated: >60 mL/min (ref >60)
Glucose, Bld: 100 mg/dL — ABNORMAL HIGH (ref 70–99)
Potassium: 4 mmol/L (ref 3.5–5.1)
Sodium: 138 mmol/L (ref 135–145)
Total Bilirubin: 1.4 mg/dL — ABNORMAL HIGH (ref 0.3–1.2)
Total Protein: 7.8 g/dL (ref 6.5–8.1)

## 2022-08-31 LAB — CBC WITH DIFFERENTIAL/PLATELET
Abs Immature Granulocytes: 0.02 10*3/uL (ref 0.00–0.07)
Basophils Absolute: 0 10*3/uL (ref 0.0–0.1)
Basophils Relative: 0 %
Eosinophils Absolute: 0.1 10*3/uL (ref 0.0–0.5)
Eosinophils Relative: 1 %
HCT: 47 % (ref 39.0–52.0)
Hemoglobin: 16.9 g/dL (ref 13.0–17.0)
Immature Granulocytes: 0 %
Lymphocytes Relative: 28 %
Lymphs Abs: 1.4 10*3/uL (ref 0.7–4.0)
MCH: 30.4 pg (ref 26.0–34.0)
MCHC: 36 g/dL (ref 30.0–36.0)
MCV: 84.5 fL (ref 80.0–100.0)
Monocytes Absolute: 0.3 10*3/uL (ref 0.1–1.0)
Monocytes Relative: 6 %
Neutro Abs: 3.3 10*3/uL (ref 1.7–7.7)
Neutrophils Relative %: 65 %
Platelets: 135 10*3/uL — ABNORMAL LOW (ref 150–400)
RBC: 5.56 MIL/uL (ref 4.22–5.81)
RDW: 12.6 % (ref 11.5–15.5)
WBC: 5.1 10*3/uL (ref 4.0–10.5)
nRBC: 0 % (ref 0.0–0.2)

## 2022-08-31 LAB — LACTATE DEHYDROGENASE: LDH: 111 U/L (ref 98–192)

## 2022-08-31 NOTE — Progress Notes (Signed)
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  He presented with symptomatic splenomegaly and mild leukopenia and thrombocytopenia.   s/p cladribine (09/30/2014 - 10/07/2014).     He has a history of severe abdominal pain. Abdominal and pelvic CT scan on 11/30/2014 revealed chronic cholelithiasis. He underwent cholecystectomy on 12/15/2014.   Abdominal pain resolved after changing jobs.   He has a history of intermittent mild hyperbilirubinemia (1.1 - 1.9).  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  Ambulating independently. Kenneth Friedman. 46 y.o.  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   Patient continues to complain of chronic back pain for which getting worse.  Complains of tingling and numbness in his extremities.  Denies any weakness in the legs.  Also complains of discomfort in the left upper quadrant.  Otherwise no fever no chills.   Review of Systems  Constitutional:  Positive for malaise/fatigue. Negative for chills, diaphoresis, fever and weight loss.  HENT:  Negative for nosebleeds and sore throat.   Eyes:  Negative for double vision.  Respiratory:  Negative for cough, hemoptysis, sputum production, shortness of breath and wheezing.   Cardiovascular:  Negative for chest pain, palpitations, orthopnea and leg swelling.  Gastrointestinal:  Negative for abdominal pain, blood in stool, constipation, diarrhea, heartburn, melena, nausea and vomiting.  Genitourinary:  Negative for  dysuria, frequency and urgency.  Musculoskeletal:  Negative for back pain and joint pain.  Skin: Negative.  Negative for itching and rash.  Neurological:  Negative for dizziness, tingling, focal weakness, weakness and headaches.  Endo/Heme/Allergies:  Does not bruise/bleed easily.  Psychiatric/Behavioral:  Negative for depression. The patient is not nervous/anxious and does not have insomnia.      MEDICAL HISTORY:  Past Medical History:  Diagnosis Date   Allergy    seasonal   Hairy cell leukemia ([place])    Hairy cell leukemia ([place]) 02/09/2015    SURGICAL HISTORY: Past Surgical History:  Procedure Laterality Date   CHOLECYSTECTOMY  12/15/14   HERNIA REPAIR  2011   INGUINAL HERNIA REPAIR     ROTATOR CUFF REPAIR Right 2009, 2010    SOCIAL HISTORY: Social History   Socioeconomic History   Marital status: Married    Spouse name: Not on file   Number of children: Not on file   Years of education: Not on file   Highest education level: Not on file  Occupational History   Not on file  Tobacco Use   Smoking status: Never   Smokeless tobacco: Never  Vaping Use   Vaping Use: Never used  Substance and Sexual Activity   Alcohol use: Not Currently   Drug use: No   Sexual activity: Not on file  Other Topics Concern   Not on file  Social History Narrative   Not on file   Social Determinants of Health   Financial Resource Strain: Not on file  Food Insecurity: Not on file  Transportation Needs: Not on file  Physical Activity: Not on file  Stress: Not on file  Social Connections:  Not on file  Intimate Partner Violence: Not on file    FAMILY HISTORY: Family History  Problem Relation Age of Onset   Hypertension Mother    Hypertension Father    Cancer Paternal Aunt    Cancer Paternal Grandmother    Cancer Paternal Grandfather     ALLERGIES:  is allergic to fentanyl and diclofenac.  MEDICATIONS:  Current Outpatient Medications  Medication Sig Dispense Refill    ascorbic acid (VITAMIN C) 1000 MG tablet Take by mouth.     cholecalciferol (VITAMIN D3) 25 MCG (1000 UNIT) tablet      Coenzyme Q10 10 MG capsule Take by mouth.     Cyanocobalamin (VITAMIN B12) 1000 MCG TBCR      ELDERBERRY PO Take by mouth.     hydrocortisone (ANUSOL-HC) 2.5 % rectal cream APPLY TOPICALLY TWICE DAILY AS NEEDED FOR HEMORRHOIDS     ibuprofen (ADVIL,MOTRIN) 200 MG tablet Take 200 mg by mouth every 6 (six) hours as needed.     Misc Natural Products (YUMVS BEET ROOT-TART CHERRY) 250-0.5 MG CHEW      Multiple Vitamin (MULTIVITAMIN) tablet Take 1 tablet by mouth daily.     Omega-3 Fatty Acids (FISH OIL) 300 MG CAPS Take by mouth.     Zinc 50 MG TABS      Cholecalciferol (VITAMIN D3) 75 MCG (3000 UT) TABS Take by mouth.     Cholecalciferol 25 MCG (1000 UT) tablet Take by mouth.     ELDERBERRY PO Take by mouth.     WHEY PROTEIN PO Take by mouth. (Patient not taking: Reported on 04/12/2021)     No current facility-administered medications for this visit.      Marland Kitchen  PHYSICAL EXAMINATION: ECOG PERFORMANCE STATUS: 0 - Asymptomatic  Vitals:   08/31/22 1112  BP: 119/83  Pulse: 62  Resp: 18  Temp: (!) 96 F (35.6 C)  SpO2: 98%   Filed Weights   08/31/22 1112  Weight: 257 lb (116.6 kg)    Physical Exam Vitals and nursing note reviewed.  Constitutional:      Comments:  Alone    HENT:     Head: Normocephalic and atraumatic.     Mouth/Throat:     Mouth: Mucous membranes are moist.     Pharynx: Oropharynx is clear. No oropharyngeal exudate.  Eyes:     Extraocular Movements: Extraocular movements intact.     Pupils: Pupils are equal, round, and reactive to light.  Cardiovascular:     Rate and Rhythm: Normal rate and regular rhythm.  Pulmonary:     Effort: Pulmonary effort is normal. No respiratory distress.     Breath sounds: Normal breath sounds. No wheezing.  Abdominal:     General: Bowel sounds are normal. There is no distension.     Palpations: Abdomen is  soft. There is no mass.     Tenderness: There is no abdominal tenderness. There is no guarding or rebound.  Musculoskeletal:        General: No tenderness. Normal range of motion.     Cervical back: Normal range of motion and neck supple.  Skin:    General: Skin is warm.  Neurological:     General: No focal deficit present.     Mental Status: He is alert and oriented to person, place, and time.  Psychiatric:        Mood and Affect: Affect normal.        Behavior: Behavior normal.  Judgment: Judgment normal.     LABORATORY DATA:  I have reviewed the data as listed Lab Results  Component Value Date   WBC 5.1 08/31/2022   HGB 16.9 08/31/2022   HCT 47.0 08/31/2022   MCV 84.5 08/31/2022   PLT 135 (L) 08/31/2022   Recent Labs    10/11/21 1021 04/10/22 1008 08/31/22 1011  NA 136 139 138  K 4.0 4.0 4.0  CL 103 109 107  CO2 '24 24 24  '$ GLUCOSE 100* 104* 100*  BUN 20 21* 19  CREATININE 1.13 1.36* 1.18  CALCIUM 9.6 9.1 9.3  GFRNONAA >60 >60 >60  PROT 7.9 7.3 7.8  ALBUMIN 4.8 4.3 4.4  AST '20 20 17  '$ ALT '29 22 22  '$ ALKPHOS 67 64 72  BILITOT 1.6* 1.1 1.4*    RADIOGRAPHIC STUDIES: I have personally reviewed the radiological images as listed and agreed with the findings in the report. US Abdomen Complete  Result Date: 09/01/2022 CLINICAL DATA:  Cirrhosis splenomegaly EXAM: ABDOMEN ULTRASOUND COMPLETE COMPARISON:  CT 08/09/2016 FINDINGS: Gallbladder: Surgically absent Common bile duct: Diameter: 3 mm Liver: Liver may be slightly echogenic. Posterior right hepatic lobe hemangioma seen on the prior CT is not well seen on sonography. Portal vein is patent on color Doppler imaging with normal direction of blood flow towards the liver. IVC: No abnormality visualized. Pancreas: Visualized portion unremarkable. Spleen: Enlarged with volume of 544 mL. Right Kidney: Length: 12.7 cm. Echogenicity within normal limits. No mass or hydronephrosis visualized. Left Kidney: Length: 12.4 cm.  Echogenicity within normal limits. No mass or hydronephrosis visualized. Abdominal aorta: No aneurysm visualized. Other findings: None. IMPRESSION: 1. Liver parenchyma may be slightly echogenic suggesting steatosis. CT demonstrated posterior right hepatic lobe hemangioma not well seen on sonography. 2. Mild splenomegaly 3. Status post cholecystectomy Electronically Signed   By: Donavan Foil M.D.   On: 09/01/2022 20:47    ASSESSMENT & PLAN:   Hairy cell leukemia not having achieved remission ([place]) #  Hairy cell leukemia [2015dec]-s/p cladribine [finished Jan 2016]; clinically is doing well without any evidence of infection/blood counts are normal except for mildly low platelets in 140s.  Continue surveillance.   # Mid back pain/hip  X 2-3 months-appears [facility]-follow-up imaging.  Recommend yoga/core exercises. ortho referral. Refer   #Diastolic blood pressure elevated at 90-systolic in 144R-154M; again recommend weight loss exercise.  #Gilbert disease-bilirubin stable.  # DISPOSITION: # US abdomen ASAP # referral to [facility] re: back pain # follow up TBD- Dr.B       All questions were answered. The patient knows to call the clinic with any problems, questions or concerns.       Cammie Sickle, MD 09/05/2022 4:35 PM

## 2022-08-31 NOTE — Assessment & Plan Note (Addendum)
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  Continue surveillance.  # Left upper quadrant pain/discomfort-no obvious splenomegaly on exam.  Would recommend ultrasound of the abdomen for further evaluation.   # Mid back pain/hip  X 2-3 months-appears [facility]. Will refer to ortho.   #Diastolic blood pressure elevated at 90-systolic in 104U-459P; again recommend weight loss exercise.  #Gilbert disease-bilirubin stable.  # DISPOSITION: # US abdomen ASAP # referral to [facility] re: back pain # follow up TBD- Dr.B  Addendum: Ultrasound abdomen shows fatty liver; mild splenomegaly-much improved from 2015 [prior to treatment for his leukemia].  Recommend weight loss; and also follow-up in 6 months. Dr.B

## 2022-08-31 NOTE — Progress Notes (Signed)
Patient here for oncology follow-up appointment, concerns of Chest tightness, pain around spleen area, &back pain with leg tingling. Requests ortho referral.

## 2022-09-01 ENCOUNTER — Ambulatory Visit
Admission: RE | Admit: 2022-09-01 | Discharge: 2022-09-01 | Disposition: A | Discharge status: Home / Self Care | Payer: BC Managed Care – PPO | Source: Ambulatory Visit | Attending: Internal Medicine | Admitting: Internal Medicine

## 2022-09-01 DIAGNOSIS — C9140 Hairy cell leukemia not having achieved remission: Secondary | ICD-10-CM | POA: Insufficient documentation

## 2022-09-05 ENCOUNTER — Other Ambulatory Visit: Payer: Self-pay | Admitting: *Deleted

## 2022-09-05 ENCOUNTER — Other Ambulatory Visit: Payer: Self-pay | Admitting: Internal Medicine

## 2022-09-05 ENCOUNTER — Encounter: Payer: Self-pay | Admitting: Internal Medicine

## 2022-09-05 DIAGNOSIS — C9140 Hairy cell leukemia not having achieved remission: Secondary | ICD-10-CM

## 2022-09-05 NOTE — Progress Notes (Signed)
Sent a MyChart message with patient.  Recommend follow-up in 6 months-MD labs CBC CMP LDH; ultrasound of the abdomen-2 to 3 days prior to next visit.Kenneth Friedman  GB

## 2022-10-11 ENCOUNTER — Ambulatory Visit: Payer: BC Managed Care – PPO | Admitting: Internal Medicine

## 2022-10-11 ENCOUNTER — Other Ambulatory Visit: Payer: BC Managed Care – PPO

## 2023-03-09 ENCOUNTER — Encounter: Payer: Self-pay | Admitting: Internal Medicine

## 2023-03-09 ENCOUNTER — Inpatient Hospital Stay (HOSPITAL_BASED_OUTPATIENT_CLINIC_OR_DEPARTMENT_OTHER): Payer: BC Managed Care – PPO | Admitting: Internal Medicine

## 2023-03-09 ENCOUNTER — Inpatient Hospital Stay: Payer: BC Managed Care – PPO | Attending: Internal Medicine

## 2023-03-09 VITALS — BP 146/91 | HR 81 | Temp 97.8°F | Ht 73.0 in | Wt 268.4 lb

## 2023-03-09 DIAGNOSIS — R1012 Left upper quadrant pain: Secondary | ICD-10-CM | POA: Diagnosis not present

## 2023-03-09 DIAGNOSIS — Z79899 Other long term (current) drug therapy: Secondary | ICD-10-CM | POA: Insufficient documentation

## 2023-03-09 DIAGNOSIS — K760 Fatty (change of) liver, not elsewhere classified: Secondary | ICD-10-CM | POA: Diagnosis not present

## 2023-03-09 DIAGNOSIS — C9140 Hairy cell leukemia not having achieved remission: Secondary | ICD-10-CM | POA: Diagnosis present

## 2023-03-09 DIAGNOSIS — E669 Obesity, unspecified: Secondary | ICD-10-CM | POA: Insufficient documentation

## 2023-03-09 LAB — COMPREHENSIVE METABOLIC PANEL
ALT: 24 U/L (ref 0–44)
AST: 20 U/L (ref 15–41)
Albumin: 4.3 g/dL (ref 3.5–5.0)
Alkaline Phosphatase: 73 U/L (ref 38–126)
Anion gap: 9 (ref 5–15)
BUN: 20 mg/dL (ref 6–20)
CO2: 22 mmol/L (ref 22–32)
Calcium: 9.2 mg/dL (ref 8.9–10.3)
Chloride: 108 mmol/L (ref 98–111)
Creatinine, Ser: 1.24 mg/dL (ref 0.61–1.24)
GFR, Estimated: >60 mL/min (ref >60)
Glucose, Bld: 96 mg/dL (ref 70–99)
Potassium: 3.9 mmol/L (ref 3.5–5.1)
Sodium: 139 mmol/L (ref 135–145)
Total Bilirubin: 0.9 mg/dL (ref 0.3–1.2)
Total Protein: 7.5 g/dL (ref 6.5–8.1)

## 2023-03-09 LAB — CBC WITH DIFFERENTIAL/PLATELET
Abs Immature Granulocytes: 0.02 10*3/uL (ref 0.00–0.07)
Basophils Absolute: 0.1 10*3/uL (ref 0.0–0.1)
Basophils Relative: 1 %
Eosinophils Absolute: 0.3 10*3/uL (ref 0.0–0.5)
Eosinophils Relative: 4 %
HCT: 44.9 % (ref 39.0–52.0)
Hemoglobin: 15.7 g/dL (ref 13.0–17.0)
Immature Granulocytes: 0 %
Lymphocytes Relative: 25 %
Lymphs Abs: 1.7 10*3/uL (ref 0.7–4.0)
MCH: 30 pg (ref 26.0–34.0)
MCHC: 35 g/dL (ref 30.0–36.0)
MCV: 85.9 fL (ref 80.0–100.0)
Monocytes Absolute: 0.5 10*3/uL (ref 0.1–1.0)
Monocytes Relative: 7 %
Neutro Abs: 4.3 10*3/uL (ref 1.7–7.7)
Neutrophils Relative %: 63 %
Platelets: 133 10*3/uL — ABNORMAL LOW (ref 150–400)
RBC: 5.23 MIL/uL (ref 4.22–5.81)
RDW: 13.1 % (ref 11.5–15.5)
WBC: 6.8 10*3/uL (ref 4.0–10.5)
nRBC: 0 % (ref 0.0–0.2)

## 2023-03-09 LAB — LACTATE DEHYDROGENASE: LDH: 117 U/L (ref 98–192)

## 2023-03-09 NOTE — Progress Notes (Signed)
C/o tender to the touch/pain in the spleen area. It also depends on what he has to eat.

## 2023-03-09 NOTE — Assessment & Plan Note (Addendum)
#    Hairy cell leukemia [2015dec]-s/p cladribine [finished Jan 2016]; clinically is doing well without any evidence of infection/blood counts are normal except for mildly low platelets in  130-140s.  Continue surveillance.  # Left upper quadrant pain/discomfort-no obvious splenomegaly on exam. Dec 2023-  Ultrasound abdomen shows fatty liver; mild splenomegaly-much improved from 2015 [prior to treatment for his leukemia]. Monitor for now.    #Diastolic blood pressure elevated at 90-systolic in 120s-130s; again recommend weight loss exercise- stable- see below.   # Fatty liver- US dec 2024-[incidental]I long discussion with the patient regarding importance of fatty liver and also overall risk of progression to cirrhosis over time.  Patient understands that fatty liver should not be of immediate concern; however unfortunately it is currently the most common cause of cirrhosis.  Again discussed regarding in the weight and weight loss.  See discussion below   # Obesity counseling: discussed importance of healthy weight/and weight loss.  Strongly recommend eating more green leafy vegetables and cutting down processed food/ carbohydrates.  Instead increasing whole grains / protein in the diet.  Multiple studies have shown that optimal weight would help improve cardiovascular risk; also shown to cut on the risk of malignancies-colon cancer, breast cancer ovarian/uterine cancer in women and also prostate cancer in men.   # DISPOSITION: # follow up in 6 months- MD; labs- cbc/cmp; prior- US abdomen  Dr.B

## 2023-03-09 NOTE — Progress Notes (Signed)
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  He presented with symptomatic splenomegaly and mild leukopenia and thrombocytopenia.   s/p cladribine (09/30/2014 - 10/07/2014).     He has a history of severe abdominal pain. Abdominal and pelvic CT scan on 11/30/2014 revealed chronic cholelithiasis. He underwent cholecystectomy on 12/15/2014.   Abdominal pain resolved after changing jobs.   He has a history of intermittent mild hyperbilirubinemia (1.1 - 1.9).  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   Hairy cell leukemia not having achieved remission ([facility])  02/09/2015 Initial Diagnosis   Hairy cell leukemia (HCC)     HISTORY OF PRESENTING ILLNESS: Alone.  Ambulating independently.  Kenneth Friedman. 47 y.o.  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   C/o tender to the touch/pain in the spleen area.  It is fleeting.  Not persistent.  Otherwise no fever no chills.  Unfortunately continues to gain weight.  Admits to dietary indiscretion.  Review of Systems  Constitutional:  Positive for malaise/fatigue. Negative for chills, diaphoresis, fever and weight loss.  HENT:  Negative for nosebleeds and sore throat.   Eyes:  Negative for double vision.  Respiratory:  Negative for cough, hemoptysis, sputum production, shortness of breath and wheezing.   Cardiovascular:  Negative for chest pain, palpitations, orthopnea and leg swelling.  Gastrointestinal:  Negative for abdominal pain, blood in stool, constipation, diarrhea, heartburn, melena, nausea and vomiting.  Genitourinary:  Negative for dysuria, frequency and urgency.  Musculoskeletal:  Negative for back  pain and joint pain.  Skin: Negative.  Negative for itching and rash.  Neurological:  Negative for dizziness, tingling, focal weakness, weakness and headaches.  Endo/Heme/Allergies:  Does not bruise/bleed easily.  Psychiatric/Behavioral:  Negative for depression. The patient is not nervous/anxious and does not have insomnia.      MEDICAL HISTORY:  Past Medical History:  Diagnosis Date   Allergy    seasonal   Hairy cell leukemia (HCC)    Hairy cell leukemia (HCC) 02/09/2015    SURGICAL HISTORY: Past Surgical History:  Procedure Laterality Date   CHOLECYSTECTOMY  12/15/14   HERNIA REPAIR  2011   INGUINAL HERNIA REPAIR     ROTATOR CUFF REPAIR Right 2009, 2010    SOCIAL HISTORY: Social History   Socioeconomic History   Marital status: Married    Spouse name: Not on file   Number of children: Not on file   Years of education: Not on file   Highest education level: Not on file  Occupational History   Not on file  Tobacco Use   Smoking status: Never   Smokeless tobacco: Never  Vaping Use   Vaping Use: Never used  Substance and Sexual Activity   Alcohol use: Not Currently   Drug use: No   Sexual activity: Not on file  Other Topics Concern   Not on file  Social History Narrative   Not on file   Social Determinants of Health   Financial Resource Strain: Not on file  Food Insecurity: Not on file  Transportation Needs: Not on file  Physical Activity: Not on file  Stress: Not on file  Social Connections: Not on file  Intimate Partner Violence: Not on file  FAMILY HISTORY: Family History  Problem Relation Age of Onset   Hypertension Mother    Hypertension Father    Cancer Paternal Aunt    Cancer Paternal Grandmother    Cancer Paternal Grandfather     ALLERGIES:  is allergic to fentanyl and diclofenac.  MEDICATIONS:  Current Outpatient Medications  Medication Sig Dispense Refill   ascorbic acid (VITAMIN C) 1000 MG tablet Take by mouth.     cholecalciferol  (VITAMIN D3) 25 MCG (1000 UNIT) tablet      Coenzyme Q10 10 MG capsule Take by mouth.     Cyanocobalamin (VITAMIN B12) 1000 MCG TBCR      ELDERBERRY PO Take by mouth.     ELDERBERRY PO Take by mouth.     hydrocortisone (ANUSOL-HC) 2.5 % rectal cream APPLY TOPICALLY TWICE DAILY AS NEEDED FOR HEMORRHOIDS     ibuprofen (ADVIL,MOTRIN) 200 MG tablet Take 200 mg by mouth every 6 (six) hours as needed.     Misc Natural Products (YUMVS BEET ROOT-TART CHERRY) 250-0.5 MG CHEW      Multiple Vitamin (MULTIVITAMIN) tablet Take 1 tablet by mouth daily.     Omega-3 Fatty Acids (FISH OIL) 300 MG CAPS Take by mouth.     Zinc 50 MG TABS      Cholecalciferol (VITAMIN D3) 75 MCG (3000 UT) TABS Take by mouth.     Cholecalciferol 25 MCG (1000 UT) tablet Take by mouth.     WHEY PROTEIN PO Take by mouth. (Patient not taking: Reported on 04/12/2021)     No current facility-administered medications for this visit.      Marland Kitchen  PHYSICAL EXAMINATION: ECOG PERFORMANCE STATUS: 0 - Asymptomatic  Vitals:   03/09/23 1530  BP: (!) 146/91  Pulse: 81  Temp: 97.8 F (36.6 C)  SpO2: 98%   Filed Weights   03/09/23 1530  Weight: 268 lb 6.4 oz (121.7 kg)    Physical Exam Vitals and nursing note reviewed.  Constitutional:      Comments:  Alone    HENT:     Head: Normocephalic and atraumatic.     Mouth/Throat:     Mouth: Mucous membranes are moist.     Pharynx: Oropharynx is clear. No oropharyngeal exudate.  Eyes:     Extraocular Movements: Extraocular movements intact.     Pupils: Pupils are equal, round, and reactive to light.  Cardiovascular:     Rate and Rhythm: Normal rate and regular rhythm.  Pulmonary:     Effort: Pulmonary effort is normal. No respiratory distress.     Breath sounds: Normal breath sounds. No wheezing.  Abdominal:     General: Bowel sounds are normal. There is no distension.     Palpations: Abdomen is soft. There is no mass.     Tenderness: There is no abdominal tenderness. There  is no guarding or rebound.  Musculoskeletal:        General: No tenderness. Normal range of motion.     Cervical back: Normal range of motion and neck supple.  Skin:    General: Skin is warm.  Neurological:     General: No focal deficit present.     Mental Status: He is alert and oriented to person, place, and time.  Psychiatric:        Mood and Affect: Affect normal.        Behavior: Behavior normal.        Judgment: Judgment normal.     LABORATORY DATA:  I have reviewed the  data as listed Lab Results  Component Value Date   WBC 6.8 03/09/2023   HGB 15.7 03/09/2023   HCT 44.9 03/09/2023   MCV 85.9 03/09/2023   PLT 133 (L) 03/09/2023   Recent Labs    04/10/22 1008 08/31/22 1011 03/09/23 1532  NA 139 138 139  K 4.0 4.0 3.9  CL 109 107 108  CO2 24 24 22   GLUCOSE 104* 100* 96  BUN 21* 19 20  CREATININE 1.36* 1.18 1.24  CALCIUM 9.1 9.3 9.2  GFRNONAA >60 >60 >60  PROT 7.3 7.8 7.5  ALBUMIN 4.3 4.4 4.3  AST 20 17 20   ALT 22 22 24   ALKPHOS 64 72 73  BILITOT 1.1 1.4* 0.9    RADIOGRAPHIC STUDIES: I have personally reviewed the radiological images as listed and agreed with the findings in the report. No results found.  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  Continue surveillance.  # Left upper quadrant pain/discomfort-no obvious splenomegaly on exam. Dec 2023-  Ultrasound abdomen shows fatty liver; mild splenomegaly-much improved from 2015 [prior to treatment for his leukemia]. Monitor for now.    #Diastolic blood pressure elevated at 90-systolic in 120s-130s; again recommend weight loss exercise- stable- see below.   # Fatty liver- US dec 2024-[incidental]I long discussion with the patient regarding importance of fatty liver and also  overall risk of progression to cirrhosis over time.  Patient understands that fatty liver should not be of immediate concern; however unfortunately it is currently the most common cause of cirrhosis.  Again discussed regarding in the weight and weight loss.  See discussion below   # Obesity counseling: discussed importance of healthy weight/and weight loss.  Strongly recommend eating more green leafy vegetables and cutting down processed food/ carbohydrates.  Instead increasing whole grains / protein in the diet.  Multiple studies have shown that optimal weight would help improve cardiovascular risk; also shown to cut on the risk of malignancies-colon cancer, breast cancer ovarian/uterine cancer in women and also prostate cancer in men.   # DISPOSITION: # follow up in 6 months- MD; labs- cbc/cmp; prior- US abdomen  Dr.B  All questions were answered. The patient knows to call the clinic with any problems, questions or concerns.    Kenneth Coder, MD 03/09/2023 4:12 PM

## 2023-09-05 ENCOUNTER — Ambulatory Visit: Payer: BC Managed Care – PPO

## 2023-09-06 ENCOUNTER — Other Ambulatory Visit: Payer: Self-pay | Admitting: *Deleted

## 2023-09-06 DIAGNOSIS — C9140 Hairy cell leukemia not having achieved remission: Secondary | ICD-10-CM

## 2023-09-10 ENCOUNTER — Encounter: Payer: Self-pay | Admitting: Internal Medicine

## 2023-09-10 ENCOUNTER — Inpatient Hospital Stay: Payer: BC Managed Care – PPO | Attending: Internal Medicine

## 2023-09-10 ENCOUNTER — Inpatient Hospital Stay (HOSPITAL_BASED_OUTPATIENT_CLINIC_OR_DEPARTMENT_OTHER): Payer: BC Managed Care – PPO | Admitting: Internal Medicine

## 2023-09-10 VITALS — BP 117/87 | HR 84 | Temp 96.3°F | Ht 73.0 in | Wt 266.2 lb

## 2023-09-10 DIAGNOSIS — K760 Fatty (change of) liver, not elsewhere classified: Secondary | ICD-10-CM | POA: Diagnosis not present

## 2023-09-10 DIAGNOSIS — R1012 Left upper quadrant pain: Secondary | ICD-10-CM | POA: Insufficient documentation

## 2023-09-10 DIAGNOSIS — C9140 Hairy cell leukemia not having achieved remission: Secondary | ICD-10-CM | POA: Diagnosis not present

## 2023-09-10 DIAGNOSIS — D696 Thrombocytopenia, unspecified: Secondary | ICD-10-CM | POA: Diagnosis not present

## 2023-09-10 DIAGNOSIS — R161 Splenomegaly, not elsewhere classified: Secondary | ICD-10-CM | POA: Insufficient documentation

## 2023-09-10 DIAGNOSIS — Z9221 Personal history of antineoplastic chemotherapy: Secondary | ICD-10-CM | POA: Diagnosis not present

## 2023-09-10 LAB — CBC WITH DIFFERENTIAL (CANCER CENTER ONLY)
Abs Immature Granulocytes: 0.02 10*3/uL (ref 0.00–0.07)
Basophils Absolute: 0 10*3/uL (ref 0.0–0.1)
Basophils Relative: 1 %
Eosinophils Absolute: 0.1 10*3/uL (ref 0.0–0.5)
Eosinophils Relative: 2 %
HCT: 46.9 % (ref 39.0–52.0)
Hemoglobin: 16.6 g/dL (ref 13.0–17.0)
Immature Granulocytes: 0 %
Lymphocytes Relative: 12 %
Lymphs Abs: 0.9 10*3/uL (ref 0.7–4.0)
MCH: 30.1 pg (ref 26.0–34.0)
MCHC: 35.4 g/dL (ref 30.0–36.0)
MCV: 85 fL (ref 80.0–100.0)
Monocytes Absolute: 0.4 10*3/uL (ref 0.1–1.0)
Monocytes Relative: 5 %
Neutro Abs: 6.3 10*3/uL (ref 1.7–7.7)
Neutrophils Relative %: 80 %
Platelet Count: 141 10*3/uL — ABNORMAL LOW (ref 150–400)
RBC: 5.52 MIL/uL (ref 4.22–5.81)
RDW: 13 % (ref 11.5–15.5)
WBC Count: 7.8 10*3/uL (ref 4.0–10.5)
nRBC: 0 % (ref 0.0–0.2)

## 2023-09-10 LAB — CMP (CANCER CENTER ONLY)
ALT: 30 U/L (ref 0–44)
AST: 22 U/L (ref 15–41)
Albumin: 4.5 g/dL (ref 3.5–5.0)
Alkaline Phosphatase: 78 U/L (ref 38–126)
Anion gap: 11 (ref 5–15)
BUN: 17 mg/dL (ref 6–20)
CO2: 25 mmol/L (ref 22–32)
Calcium: 9.4 mg/dL (ref 8.9–10.3)
Chloride: 105 mmol/L (ref 98–111)
Creatinine: 1.13 mg/dL (ref 0.61–1.24)
GFR, Estimated: >60 mL/min (ref >60)
Glucose, Bld: 96 mg/dL (ref 70–99)
Potassium: 4 mmol/L (ref 3.5–5.1)
Sodium: 141 mmol/L (ref 135–145)
Total Bilirubin: 1.1 mg/dL (ref <1.2)
Total Protein: 7.7 g/dL (ref 6.5–8.1)

## 2023-09-10 LAB — LACTATE DEHYDROGENASE: LDH: 123 U/L (ref 98–192)

## 2023-09-10 NOTE — Progress Notes (Signed)
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  He presented with symptomatic splenomegaly and mild leukopenia and thrombocytopenia.   s/p cladribine (09/30/2014 - 10/07/2014).     He has a history of severe abdominal pain. Abdominal and pelvic CT scan on 11/30/2014 revealed chronic cholelithiasis. He underwent cholecystectomy on 12/15/2014.   Abdominal pain resolved after changing jobs.   He has a history of intermittent mild hyperbilirubinemia (1.1 - 1.9).  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  Ambulating independently.  Kenneth Friedman. 47 y.o.  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   Patient complains of chronic intermittent left upper quadrant abdominal pain.  He declined ultrasound of the abdomen that was ordered last visit.   Appetite 50% normal, no supplement drinks. Having some SOB with activity, he contributes to not being as active.    He admits to poor eating habits; and lack of exercise. Otherwise no fever no chills.  Unfortunately continues to gain weight.  Review of Systems  Constitutional:  Positive for malaise/fatigue. Negative for chills, diaphoresis, fever and weight loss.  HENT:  Negative for nosebleeds and sore throat.   Eyes:  Negative for double vision.  Respiratory:  Negative for cough, hemoptysis, sputum production, shortness of breath and wheezing.   Cardiovascular:  Negative for chest pain,  palpitations, orthopnea and leg swelling.  Gastrointestinal:  Negative for abdominal pain, blood in stool, constipation, diarrhea, heartburn, melena, nausea and vomiting.  Genitourinary:  Negative for dysuria, frequency and urgency.  Musculoskeletal:  Negative for back pain and joint pain.  Skin: Negative.  Negative for itching and rash.  Neurological:  Negative for dizziness, tingling, focal weakness, weakness and headaches.  Endo/Heme/Allergies:  Does not bruise/bleed easily.  Psychiatric/Behavioral:  Negative for depression. The patient is not nervous/anxious and does not have insomnia.      MEDICAL HISTORY:  Past Medical History:  Diagnosis Date   Allergy    seasonal   Hairy cell leukemia (HCC)    Hairy cell leukemia (HCC) 02/09/2015    SURGICAL HISTORY: Past Surgical History:  Procedure Laterality Date   CHOLECYSTECTOMY  12/15/14   HERNIA REPAIR  2011   INGUINAL HERNIA REPAIR     ROTATOR CUFF REPAIR Right 2009, 2010    SOCIAL HISTORY: Social History   Socioeconomic History   Marital status: Married    Spouse name: Not on file   Number of children: Not on file   Years of education: Not on file   Highest education level: Not on file  Occupational History   Not on file  Tobacco Use   Smoking status: Never   Smokeless tobacco: Never  Vaping Use   Vaping status: Never Used  Substance and Sexual Activity   Alcohol use: Not Currently   Drug use: No   Sexual activity: Not on file  Other Topics Concern   Not on file  Social History Narrative   Not on file   Social Drivers of Health   Financial Resource Strain:  Patient Declined (02/06/2023)   Received from [facility], [facility]   Overall Financial Resource Strain (CARDIA)    Difficulty of Paying Living Expenses: Patient declined  Food Insecurity: Patient Declined (02/06/2023)   Received from [facility], [facility]   Hunger Vital Sign     Worried About Running Out of Food in the Last Year: Patient declined    Ran Out of Food in the Last Year: Patient declined  Transportation Needs: Patient Declined (02/06/2023)   Received from [facility], [facility]   PRAPARE - Transportation    In the past 12 months, has lack of transportation kept you from medical appointments or from getting medications?: Patient declined    Lack of Transportation (Non-Medical): Patient declined  Physical Activity: Not on file  Stress: Not on file  Social Connections: Not on file  Intimate Partner Violence: Not on file    FAMILY HISTORY: Family History  Problem Relation Age of Onset   Hypertension Mother    Hypertension Father    Cancer Paternal Aunt    Cancer Paternal Grandmother    Cancer Paternal Grandfather     ALLERGIES:  is allergic to fentanyl and diclofenac.  MEDICATIONS:  Current Outpatient Medications  Medication Sig Dispense Refill   ascorbic acid (VITAMIN C) 1000 MG tablet Take by mouth.     cholecalciferol (VITAMIN D3) 25 MCG (1000 UNIT) tablet      Cholecalciferol (VITAMIN D3) 75 MCG (3000 UT) TABS Take by mouth.     Cholecalciferol 25 MCG (1000 UT) tablet Take by mouth.     Coenzyme Q10 10 MG capsule Take by mouth.     Cyanocobalamin (VITAMIN B12) 1000 MCG TBCR      ELDERBERRY PO Take by mouth.     ELDERBERRY PO Take by mouth.     hydrocortisone (ANUSOL-HC) 2.5 % rectal cream APPLY TOPICALLY TWICE DAILY AS NEEDED FOR HEMORRHOIDS     ibuprofen (ADVIL,MOTRIN) 200 MG tablet Take 200 mg by mouth every 6 (six) hours as needed.     Misc Natural Products (YUMVS BEET ROOT-TART CHERRY) 250-0.5 MG CHEW      Multiple Vitamin (MULTIVITAMIN) tablet Take 1 tablet by mouth daily.     Omega-3 Fatty Acids (FISH OIL) 300 MG CAPS Take by mouth.     Zinc 50 MG TABS      WHEY PROTEIN PO Take by mouth. (Patient not taking: Reported on 09/10/2023)     No current facility-administered medications for this  visit.   PHYSICAL EXAMINATION: ECOG PERFORMANCE STATUS: 0 - Asymptomatic  Vitals:   09/10/23 1416  BP: 117/87  Pulse: 84  Temp: (!) 96.3 F (35.7 C)  SpO2: 100%    Filed Weights   09/10/23 1416  Weight: 266 lb 3.2 oz (120.7 kg)     Physical Exam Vitals and nursing note reviewed.  Constitutional:      Comments:  Alone    HENT:     Head: Normocephalic and atraumatic.     Mouth/Throat:     Mouth: Mucous membranes are moist.     Pharynx: Oropharynx is clear. No oropharyngeal exudate.  Eyes:     Extraocular Movements: Extraocular movements intact.     Pupils: Pupils are equal, round, and reactive to light.  Cardiovascular:     Rate and Rhythm: Normal rate and regular rhythm.  Pulmonary:     Effort: Pulmonary effort is normal. No respiratory distress.  Breath sounds: Normal breath sounds. No wheezing.  Abdominal:     General: Bowel sounds are normal. There is no distension.     Palpations: Abdomen is soft. There is no mass.     Tenderness: There is no abdominal tenderness. There is no guarding or rebound.  Musculoskeletal:        General: No tenderness. Normal range of motion.     Cervical back: Normal range of motion and neck supple.  Skin:    General: Skin is warm.  Neurological:     General: No focal deficit present.     Mental Status: He is alert and oriented to person, place, and time.  Psychiatric:        Mood and Affect: Affect normal.        Behavior: Behavior normal.        Judgment: Judgment normal.     LABORATORY DATA:  I have reviewed the data as listed Lab Results  Component Value Date   WBC 7.8 09/10/2023   HGB 16.6 09/10/2023   HCT 46.9 09/10/2023   MCV 85.0 09/10/2023   PLT 141 (L) 09/10/2023   Recent Labs    03/09/23 1532 09/10/23 1406  NA 139 141  K 3.9 4.0  CL 108 105  CO2 22 25  GLUCOSE 96 96  BUN 20 17  CREATININE 1.24 1.13  CALCIUM 9.2 9.4  GFRNONAA >60 >60  PROT 7.5 7.7  ALBUMIN 4.3 4.5  AST 20 22  ALT 24 30   ALKPHOS 73 78  BILITOT 0.9 1.1    RADIOGRAPHIC STUDIES: I have personally reviewed the radiological images as listed and agreed with the findings in the report. No results found.  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  Continue surveillance.  # Left upper quadrant pain/discomfort-no obvious splenomegaly on exam. Dec 2023-  Ultrasound abdomen shows fatty liver; mild splenomegaly-much improved from 2015 [prior to treatment for his leukemia]. No splenomegaly on exam- OK to HOLD off imaging.   #Diastolic blood pressure elevated at 90-systolic in 120s-130s- stable.   # Fatty liver- US dec 2024-[incidental]I long discussion with the patient regarding importance of fatty liver and also overall risk of progression to cirrhosis over time- stable. Defer to PCP re: referral to Obesity medicine.   # DISPOSITION: # follow up in 6 months- MD; labs- cbc/cmp; [facility]-  Dr.B  All questions were answered. The patient knows to call the clinic with any problems, questions or concerns.    Earna Coder, MD 09/10/2023 3:38 PM

## 2023-09-10 NOTE — Assessment & Plan Note (Addendum)
#    Hairy cell leukemia [2015dec]-s/p cladribine [finished Jan 2016]; clinically is doing well without any evidence of infection/blood counts are normal except for mildly low platelets in  130-140s.  Continue surveillance.  # Left upper quadrant pain/discomfort-no obvious splenomegaly on exam. Dec 2023-  Ultrasound abdomen shows fatty liver; mild splenomegaly-much improved from 2015 [prior to treatment for his leukemia]. No splenomegaly on exam- OK to HOLD off imaging.   #Diastolic blood pressure elevated at 90-systolic in 120s-130s- stable.   # Fatty liver- US dec 2024-[incidental]I long discussion with the patient regarding importance of fatty liver and also overall risk of progression to cirrhosis over time- stable. Defer to PCP re: referral to Obesity medicine.   # DISPOSITION: # follow up in 6 months- MD; labs- cbc/cmp; [facility]-  Dr.B

## 2023-09-10 NOTE — Progress Notes (Signed)
C/o generalized pain, pain "near spleen", 2/3. Cancelled u/s.  Appetite 50% normal, no supplement drinks.  Having some SOB with activity, he contributes to not being as active.  C/o more fatigue, shape pain left side, and a lump behind right ear, stiffness.

## 2023-10-11 ENCOUNTER — Other Ambulatory Visit: Payer: Self-pay | Admitting: Cardiology

## 2023-10-11 DIAGNOSIS — R0602 Shortness of breath: Secondary | ICD-10-CM

## 2023-10-11 DIAGNOSIS — R079 Chest pain, unspecified: Secondary | ICD-10-CM

## 2023-10-11 DIAGNOSIS — Z8249 Family history of ischemic heart disease and other diseases of the circulatory system: Secondary | ICD-10-CM

## 2023-10-17 ENCOUNTER — Telehealth (HOSPITAL_COMMUNITY): Payer: Self-pay | Admitting: *Deleted

## 2023-10-17 NOTE — Telephone Encounter (Signed)
Attempted to call patient regarding upcoming cardiac CT appointment. Left message on voicemail with name and callback number Hayley Sharpe RN Navigator Cardiac Imaging [facility] [phone] Office   

## 2023-10-18 ENCOUNTER — Ambulatory Visit
Admission: RE | Admit: 2023-10-18 | Discharge: 2023-10-18 | Disposition: A | Discharge status: Home / Self Care | Payer: 59 | Source: Ambulatory Visit | Attending: Cardiology | Admitting: Cardiology

## 2023-10-18 DIAGNOSIS — R0602 Shortness of breath: Secondary | ICD-10-CM | POA: Diagnosis present

## 2023-10-18 DIAGNOSIS — Z8249 Family history of ischemic heart disease and other diseases of the circulatory system: Secondary | ICD-10-CM | POA: Diagnosis present

## 2023-10-18 DIAGNOSIS — R079 Chest pain, unspecified: Secondary | ICD-10-CM | POA: Diagnosis present

## 2023-10-18 MED ORDER — SODIUM CHLORIDE 0.9 % IV BOLUS
150.0000 mL | Freq: Once | INTRAVENOUS | Status: AC
  Administered 2023-10-18: 150 mL via INTRAVENOUS

## 2023-10-18 MED ORDER — IOHEXOL 350 MG/ML SOLN
100.0000 mL | Freq: Once | INTRAVENOUS | Status: AC | PRN
  Administered 2023-10-18: 100 mL via INTRAVENOUS

## 2023-10-18 MED ORDER — NITROGLYCERIN 0.4 MG SL SUBL
0.8000 mg | SUBLINGUAL_TABLET | Freq: Once | SUBLINGUAL | Status: AC
  Administered 2023-10-18: 0.8 mg via SUBLINGUAL

## 2023-10-18 NOTE — Progress Notes (Signed)
Patient presents for a cardiac CTand tolerated procedure without incident. Patient maintained acceptable vital signs throughout the test, denies symptoms and was offered water after test.  Patient ambulated out of department with a steady gait.

## 2023-12-29 ENCOUNTER — Ambulatory Visit
Admission: EM | Admit: 2023-12-29 | Discharge: 2023-12-29 | Disposition: A | Discharge status: Home / Self Care | Attending: Emergency Medicine | Admitting: Emergency Medicine

## 2023-12-29 ENCOUNTER — Encounter: Payer: Self-pay | Admitting: Emergency Medicine

## 2023-12-29 DIAGNOSIS — B372 Candidiasis of skin and nail: Secondary | ICD-10-CM | POA: Diagnosis not present

## 2023-12-29 MED ORDER — NYSTATIN 100000 UNIT/GM EX CREA: TOPICAL_CREAM | CUTANEOUS | 1 refills | Status: DC

## 2023-12-29 NOTE — Discharge Instructions (Addendum)
Use the nystatin cream as directed.  Follow up with your primary care provider if your symptoms are not improving.

## 2023-12-29 NOTE — ED Provider Notes (Signed)
 Kenneth Friedman    CSN: [id number] Arrival date & time: 12/29/23  1408      History   Chief Complaint No chief complaint on file.   HPI Kenneth Januszewski. is a 48 y.o. male.  Patient presents with a painful area at the top of his gluteal fold x 2 days.  He states he noticed a small amount of blood on tissue today when he wiped the area.  He states he had a pilonidal cyst in this area when he was 48 years old which was surgically removed.  He is concerned for recurrence of the pilonidal cyst.  He denies fever, chills, difficulty with bowel movements, purulent drainage.  The history is provided by the patient and medical records.    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 Authorizing Provider  nystatin cream (MYCOSTATIN) Apply to affected area 2 times daily 12/29/23  Yes Mickie Bail, NP  ascorbic acid (VITAMIN C) 1000 MG tablet Take by mouth.    [provider]  cholecalciferol (VITAMIN D3) 25 MCG (1000 UNIT) tablet  04/25/20   [provider]  Cholecalciferol (VITAMIN D3) 75 MCG (3000 UT) TABS Take by mouth.    [provider]  Cholecalciferol 25 MCG (1000 UT) tablet Take by mouth.    [provider]  Coenzyme Q10 10 MG capsule Take by  mouth.    [provider]  Cyanocobalamin (VITAMIN B12) 1000 MCG TBCR  06/25/21   [provider]  ELDERBERRY PO Take by mouth.    [provider]  ELDERBERRY PO Take by mouth.    [provider]  hydrocortisone (ANUSOL-HC) 2.5 % rectal cream APPLY TOPICALLY TWICE DAILY AS NEEDED FOR HEMORRHOIDS 07/17/16   [provider]  ibuprofen (ADVIL,MOTRIN) 200 MG tablet Take 200 mg by mouth every 6 (six) hours as needed.    [provider]  Misc Natural Products (YUMVS BEET ROOT-TART CHERRY) 250-0.5 MG CHEW  06/25/21   [provider]  Multiple Vitamin (MULTIVITAMIN) tablet Take 1 tablet by mouth daily.    [provider]  Omega-3 Fatty Acids (FISH OIL) 300 MG CAPS Take by mouth.    [provider]  WHEY PROTEIN PO Take by mouth. Patient not taking: Reported on 09/10/2023    [provider]  Zinc 50 MG TABS  05/26/21   [provider]    Family History Family History  Problem Relation Age of Onset   Hypertension Mother    Hypertension Father    Cancer Paternal Aunt    Cancer Paternal Grandmother    Cancer Paternal Grandfather     Social History Social History   Tobacco  Use   Smoking status: Never   Smokeless tobacco: Never  Vaping Use   Vaping status: Never Used  Substance Use Topics   Alcohol use: Not Currently   Drug use: No     Allergies   Fentanyl and Diclofenac   Review of Systems Review of Systems  Constitutional:  Negative for chills and fever.  Gastrointestinal:  Negative for anal bleeding, blood in stool, constipation and diarrhea.  Skin:  Positive for color change and wound.     Physical Exam Triage Vital Signs ED Triage Vitals [12/29/23 1425]  Encounter Vitals Group     BP 134/77     Systolic BP Percentile      Diastolic BP Percentile      Pulse Rate 81     Resp 18     Temp 97.8 F (36.6 C)     Temp src      SpO2 96 %     Weight      Height      Head  Circumference      Peak Flow      Pain Score      Pain Loc      Pain Education      Exclude from Growth Chart    No data found.  Updated Vital Signs BP 134/77   Pulse 81   Temp 97.8 F (36.6 C)   Resp 18   SpO2 96%   Visual Acuity Right Eye Distance:   Left Eye Distance:   Bilateral Distance:    Right Eye Near:   Left Eye Near:    Bilateral Near:     Physical Exam Constitutional:      General: He is not in acute distress. HENT:     Mouth/Throat:     Mouth: Mucous membranes are moist.  Cardiovascular:     Rate and Rhythm: Normal rate and regular rhythm.  Pulmonary:     Effort: Pulmonary effort is normal. No respiratory distress.  Skin:    General: Skin is warm and dry.     Findings: Erythema present. No lesion.     Comments: Top of gluteal fold: Brightly erythematous rash.  No wounds, induration, drainage.  Neurological:     Mental Status: He is alert.      UC Treatments / Results  Labs (all labs ordered are listed, but only abnormal results are displayed) Labs Reviewed - No data to display  EKG   Radiology No results found.  Procedures Procedures (including critical care time)  Medications Ordered in UC Medications - No data to display  Initial Impression / Assessment and Plan / UC Course  I have reviewed the triage vital signs and the nursing notes.  Pertinent labs & imaging results that were available during my care of the patient were reviewed by me and considered in my medical decision making (see chart for details).   Candidal dermatitis.  Treating with nystatin cream.  Education provided on skin yeast infection.  Discussed symptomatic management.  Instructed patient to follow-up with his PCP if he is not improving.  He agrees to plan of care.   Final Clinical Impressions(s) / UC Diagnoses   Final diagnoses:  Candidal dermatitis     Discharge Instructions      Use the nystatin cream as directed.  Follow-up with your primary care  provider if your symptoms are not improving.     ED Prescriptions     Medication Sig Dispense Auth. Provider   nystatin cream (  MYCOSTATIN) Apply to affected area 2 times daily 30 g Mickie Bail, NP      PDMP not reviewed this encounter.   Mickie Bail, NP 12/29/23 [phone]

## 2024-03-10 ENCOUNTER — Inpatient Hospital Stay: Payer: BC Managed Care – PPO | Attending: Family Medicine

## 2024-03-10 ENCOUNTER — Inpatient Hospital Stay: Payer: BC Managed Care – PPO | Admitting: Internal Medicine

## 2024-03-10 ENCOUNTER — Encounter: Payer: Self-pay | Admitting: Internal Medicine

## 2024-03-10 VITALS — BP 144/79 | HR 73 | Temp 97.5°F | Resp 18 | Wt 260.0 lb

## 2024-03-10 DIAGNOSIS — C9140 Hairy cell leukemia not having achieved remission: Secondary | ICD-10-CM | POA: Insufficient documentation

## 2024-03-10 DIAGNOSIS — Z79899 Other long term (current) drug therapy: Secondary | ICD-10-CM | POA: Insufficient documentation

## 2024-03-10 LAB — CBC WITH DIFFERENTIAL (CANCER CENTER ONLY)
Abs Immature Granulocytes: 0.02 10*3/uL (ref 0.00–0.07)
Basophils Absolute: 0 10*3/uL (ref 0.0–0.1)
Basophils Relative: 1 %
Eosinophils Absolute: 0.2 10*3/uL (ref 0.0–0.5)
Eosinophils Relative: 3 %
HCT: 45.9 % (ref 39.0–52.0)
Hemoglobin: 16.1 g/dL (ref 13.0–17.0)
Immature Granulocytes: 0 %
Lymphocytes Relative: 25 %
Lymphs Abs: 1.6 10*3/uL (ref 0.7–4.0)
MCH: 30.1 pg (ref 26.0–34.0)
MCHC: 35.1 g/dL (ref 30.0–36.0)
MCV: 86 fL (ref 80.0–100.0)
Monocytes Absolute: 0.4 10*3/uL (ref 0.1–1.0)
Monocytes Relative: 6 %
Neutro Abs: 4.1 10*3/uL (ref 1.7–7.7)
Neutrophils Relative %: 65 %
Platelet Count: 129 10*3/uL — ABNORMAL LOW (ref 150–400)
RBC: 5.34 MIL/uL (ref 4.22–5.81)
RDW: 13.1 % (ref 11.5–15.5)
WBC Count: 6.3 10*3/uL (ref 4.0–10.5)
nRBC: 0 % (ref 0.0–0.2)

## 2024-03-10 LAB — CMP (CANCER CENTER ONLY)
ALT: 37 U/L (ref 0–44)
AST: 26 U/L (ref 15–41)
Albumin: 4.3 g/dL (ref 3.5–5.0)
Alkaline Phosphatase: 66 U/L (ref 38–126)
Anion gap: 9 (ref 5–15)
BUN: 19 mg/dL (ref 6–20)
CO2: 22 mmol/L (ref 22–32)
Calcium: 9.5 mg/dL (ref 8.9–10.3)
Chloride: 106 mmol/L (ref 98–111)
Creatinine: 1.11 mg/dL (ref 0.61–1.24)
GFR, Estimated: >60 mL/min (ref >60)
Glucose, Bld: 119 mg/dL — ABNORMAL HIGH (ref 70–99)
Potassium: 3.9 mmol/L (ref 3.5–5.1)
Sodium: 137 mmol/L (ref 135–145)
Total Bilirubin: 1 mg/dL (ref 0.0–1.2)
Total Protein: 7.6 g/dL (ref 6.5–8.1)

## 2024-03-10 LAB — LACTATE DEHYDROGENASE: LDH: 115 U/L (ref 98–192)

## 2024-03-10 NOTE — Progress Notes (Signed)
 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  He presented with symptomatic splenomegaly and mild leukopenia and thrombocytopenia.   s/p cladribine (09/30/2014 - 10/07/2014).     He has a history of severe abdominal pain. Abdominal and pelvic CT scan on 11/30/2014 revealed chronic cholelithiasis. He underwent cholecystectomy on 12/15/2014.   Abdominal pain resolved after changing jobs.   He has a history of intermittent mild hyperbilirubinemia (1.1 - 1.9).  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  Ambulating independently.  Kenneth Friedman. 48 y.o.  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   Patient has a spot under his left knee.  No itching.  No growing.  No bleeding.    The pain in his back, and his hip pain is what is really bothering him now. Has been working on heavy equipment. On physical therapy.   Review of Systems  Constitutional:  Positive for malaise/fatigue. Negative for chills, diaphoresis, fever and weight loss.  HENT:  Negative for nosebleeds and sore throat.   Eyes:  Negative for double vision.  Respiratory:  Negative for cough, hemoptysis, sputum production, shortness of breath and wheezing.   Cardiovascular:  Negative for chest pain, palpitations, orthopnea and leg swelling.  Gastrointestinal:  Negative for abdominal pain, blood in stool, constipation, diarrhea, heartburn, melena, nausea and vomiting.   Genitourinary:  Negative for dysuria, frequency and urgency.  Musculoskeletal:  Negative for back pain and joint pain.  Skin: Negative.  Negative for itching and rash.  Neurological:  Negative for dizziness, tingling, focal weakness, weakness and headaches.  Endo/Heme/Allergies:  Does not bruise/bleed easily.  Psychiatric/Behavioral:  Negative for depression. The patient is not nervous/anxious and does not have insomnia.      MEDICAL HISTORY:  Past Medical History:  Diagnosis Date   Allergy    seasonal   Hairy cell leukemia (HCC)    Hairy cell leukemia (HCC) 02/09/2015    SURGICAL HISTORY: Past Surgical History:  Procedure Laterality Date   CHOLECYSTECTOMY  12/15/14   HERNIA REPAIR  2011   INGUINAL HERNIA REPAIR     ROTATOR CUFF REPAIR Right 2009, 2010    SOCIAL HISTORY: Social History   Socioeconomic History   Marital status: Married    Spouse name: Not on file   Number of children: Not on file   Years of education: Not on file   Highest education level: Not on file  Occupational History   Not on file  Tobacco Use   Smoking status: Never   Smokeless tobacco: Never  Vaping Use   Vaping status: Never Used  Substance and Sexual Activity   Alcohol use: Not Currently   Drug use: No   Sexual activity: Not on file  Other Topics Concern   Not on file  Social History Narrative   Not on file   Social Drivers of Health   Financial Resource Strain: Patient Declined (02/06/2024)   Received from [facility]   Overall Financial Resource Strain (CARDIA)  Difficulty of Paying Living Expenses: Patient declined  Food Insecurity: Patient Declined (02/06/2024)   Received from [facility]   Hunger Vital Sign    Within the past 12 months, you worried that your food would run out before you got the money to buy more.: Patient declined    Within the past 12 months, the food you bought just didn't last and you didn't have money to get more.:  Patient declined  Transportation Needs: Patient Declined (02/06/2024)   Received from [facility] - Transportation    In the past 12 months, has lack of transportation kept you from medical appointments or from getting medications?: Patient declined    Lack of Transportation (Non-Medical): Patient declined  Physical Activity: Not on file  Stress: Not on file  Social Connections: Not on file  Intimate Partner Violence: Not on file    FAMILY HISTORY: Family History  Problem Relation Age of Onset   Hypertension Mother    Hypertension Father    Cancer Paternal Aunt    Cancer Paternal Grandmother    Cancer Paternal Grandfather     ALLERGIES:  is allergic to fentanyl and diclofenac.  MEDICATIONS:  Current Outpatient Medications  Medication Sig Dispense Refill   ascorbic acid (VITAMIN C) 1000 MG tablet Take by mouth.     cholecalciferol (VITAMIN D3) 25 MCG (1000 UNIT) tablet      Cholecalciferol (VITAMIN D3) 75 MCG (3000 UT) TABS Take by mouth.     Cholecalciferol 25 MCG (1000 UT) tablet Take by mouth.     Coenzyme Q10 10 MG capsule Take by mouth.     Cyanocobalamin (VITAMIN B12) 1000 MCG TBCR      ELDERBERRY PO Take by mouth.     ELDERBERRY PO Take by mouth.     hydrocortisone (ANUSOL-HC) 2.5 % rectal cream APPLY TOPICALLY TWICE DAILY AS NEEDED FOR HEMORRHOIDS     ibuprofen (ADVIL,MOTRIN) 200 MG tablet Take 200 mg by mouth every 6 (six) hours as needed.     Misc Natural Products (YUMVS BEET ROOT-TART CHERRY) 250-0.5 MG CHEW      Multiple Vitamin (MULTIVITAMIN) tablet Take 1 tablet by mouth daily.     nystatin  cream (MYCOSTATIN ) Apply to affected area 2 times daily 30 g 1   Omega-3 Fatty Acids (FISH OIL) 300 MG CAPS Take by mouth.     WHEY PROTEIN PO Take by mouth.     Zinc 50 MG TABS      No current facility-administered medications for this visit.   PHYSICAL EXAMINATION: ECOG PERFORMANCE STATUS: 0 - Asymptomatic  Vitals:   03/10/24 1028 03/10/24  1034  BP: (!) 130/98 (!) 144/79  Pulse: 73   Resp: 18   Temp: (!) 97.5 F (36.4 C)   SpO2: 99%     Filed Weights   03/10/24 1028  Weight: 260 lb (117.9 kg)   Raised papule-  under his left knee  Physical Exam Vitals and nursing note reviewed.  Constitutional:      Comments:  Alone    HENT:     Head: Normocephalic and atraumatic.     Mouth/Throat:     Mouth: Mucous membranes are moist.     Pharynx: Oropharynx is clear. No oropharyngeal exudate.   Eyes:     Extraocular Movements: Extraocular movements intact.     Pupils: Pupils are equal, round, and reactive to light.    Cardiovascular:     Rate and Rhythm: Normal rate and  regular rhythm.  Pulmonary:     Effort: Pulmonary effort is normal. No respiratory distress.     Breath sounds: Normal breath sounds. No wheezing.  Abdominal:     General: Bowel sounds are normal. There is no distension.     Palpations: Abdomen is soft. There is no mass.     Tenderness: There is no abdominal tenderness. There is no guarding or rebound.   Musculoskeletal:        General: No tenderness. Normal range of motion.     Cervical back: Normal range of motion and neck supple.   Skin:    General: Skin is warm.   Neurological:     General: No focal deficit present.     Mental Status: He is alert and oriented to person, place, and time.   Psychiatric:        Mood and Affect: Affect normal.        Behavior: Behavior normal.        Judgment: Judgment normal.     LABORATORY DATA:  I have reviewed the data as listed Lab Results  Component Value Date   WBC 6.3 03/10/2024   HGB 16.1 03/10/2024   HCT 45.9 03/10/2024   MCV 86.0 03/10/2024   PLT 129 (L) 03/10/2024   Recent Labs    09/10/23 1406 03/10/24 1013  NA 141 137  K 4.0 3.9  CL 105 106  CO2 25 22  GLUCOSE 96 119*  BUN 17 19  CREATININE 1.13 1.11  CALCIUM 9.4 9.5  GFRNONAA >60 >60  PROT 7.7 7.6  ALBUMIN 4.5 4.3  AST 22 26  ALT 30 37  ALKPHOS 78 66  BILITOT 1.1  1.0    RADIOGRAPHIC STUDIES: I have personally reviewed the radiological images as listed and agreed with the findings in the report. No results found.  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  Continue surveillance.  # Right hip pain [[facility]- [place]]- s/p MRI- . On physical therapy- stable/improving.   # Raised papule-  under his left knee-clinically NOT suggestive of melanoma-SCC versus benign lesion.  Recommend evaluation with dermatology.  # DISPOSITION: # follow up in 6 months- MD; labs- cbc/cmp; [facility]-  Dr.B  All questions were answered. The patient knows to call the clinic with any problems, questions or concerns.    Gwyn Leos, MD 03/10/2024 10:49 AM

## 2024-03-10 NOTE — Progress Notes (Signed)
 Patient has a spot under his left knee. The pain in his back, and his hip pain is what is really bothering him now.

## 2024-03-10 NOTE — Assessment & Plan Note (Addendum)
#    Hairy cell leukemia [2015dec]-s/p cladribine [finished Jan 2016]; clinically is doing well without any evidence of infection/blood counts are normal except for mildly low platelets in  130-140s.  Continue surveillance.  # Right hip pain [[facility]- [place]]- s/p MRI- . On physical therapy- stable/improving.   # Raised papule-  under his left knee-clinically NOT suggestive of melanoma-SCC versus benign lesion.  Recommend evaluation with dermatology.  # DISPOSITION: # follow up in 6 months- MD; labs- cbc/cmp; [facility]-  Dr.B

## 2024-06-26 ENCOUNTER — Emergency Department
Admission: EM | Admit: 2024-06-26 | Discharge: 2024-06-27 | Disposition: A | Discharge status: Home / Self Care | Attending: Emergency Medicine | Admitting: Emergency Medicine

## 2024-06-26 ENCOUNTER — Other Ambulatory Visit: Payer: Self-pay

## 2024-06-26 ENCOUNTER — Encounter: Payer: Self-pay | Admitting: Emergency Medicine

## 2024-06-26 DIAGNOSIS — R1012 Left upper quadrant pain: Secondary | ICD-10-CM | POA: Diagnosis present

## 2024-06-26 LAB — CBC
HCT: 43.7 % (ref 39.0–52.0)
Hemoglobin: 15.7 g/dL (ref 13.0–17.0)
MCH: 30.8 pg (ref 26.0–34.0)
MCHC: 35.9 g/dL (ref 30.0–36.0)
MCV: 85.7 fL (ref 80.0–100.0)
Platelets: 138 K/uL — ABNORMAL LOW (ref 150–400)
RBC: 5.1 MIL/uL (ref 4.22–5.81)
RDW: 12.9 % (ref 11.5–15.5)
WBC: 8.2 K/uL (ref 4.0–10.5)
nRBC: 0 % (ref 0.0–0.2)

## 2024-06-26 LAB — URINALYSIS, ROUTINE W REFLEX MICROSCOPIC
Bilirubin Urine: NEGATIVE
Glucose, UA: NEGATIVE mg/dL
Hgb urine dipstick: NEGATIVE
Ketones, ur: NEGATIVE mg/dL
Leukocytes,Ua: NEGATIVE
Nitrite: NEGATIVE
Protein, ur: NEGATIVE mg/dL
Specific Gravity, Urine: 1.01 (ref 1.005–1.030)
pH: 6 (ref 5.0–8.0)

## 2024-06-26 NOTE — ED Triage Notes (Addendum)
 Pt arrives POV, ambulatory to triage, gait steady, no acute distress noted c/o spleen pain; pt reports LUQ pain all day, denies n/v. Hx of hairy cell leukemia in 2015 and has had intermittent spleen pain since and has managed until tonight, pain is worse. Pt also reports pain w/ inspiration

## 2024-06-26 NOTE — ED Notes (Addendum)
(  2) green and (1) lavender top tubes collected and sent to lab

## 2024-06-27 ENCOUNTER — Emergency Department

## 2024-06-27 ENCOUNTER — Telehealth: Payer: Self-pay | Admitting: *Deleted

## 2024-06-27 ENCOUNTER — Inpatient Hospital Stay: Attending: Hospice and Palliative Medicine | Admitting: Hospice and Palliative Medicine

## 2024-06-27 VITALS — BP 127/78 | HR 78 | Resp 18 | Ht 73.0 in | Wt 271.0 lb

## 2024-06-27 DIAGNOSIS — C9141 Hairy cell leukemia, in remission: Secondary | ICD-10-CM | POA: Diagnosis not present

## 2024-06-27 DIAGNOSIS — D696 Thrombocytopenia, unspecified: Secondary | ICD-10-CM | POA: Insufficient documentation

## 2024-06-27 DIAGNOSIS — C9140 Hairy cell leukemia not having achieved remission: Secondary | ICD-10-CM | POA: Diagnosis present

## 2024-06-27 DIAGNOSIS — Z79899 Other long term (current) drug therapy: Secondary | ICD-10-CM | POA: Diagnosis not present

## 2024-06-27 LAB — COMPREHENSIVE METABOLIC PANEL WITH GFR
ALT: 35 U/L (ref 0–44)
AST: 27 U/L (ref 15–41)
Albumin: 4.2 g/dL (ref 3.5–5.0)
Alkaline Phosphatase: 68 U/L (ref 38–126)
Anion gap: 11 (ref 5–15)
BUN: 20 mg/dL (ref 6–20)
CO2: 24 mmol/L (ref 22–32)
Calcium: 9.5 mg/dL (ref 8.9–10.3)
Chloride: 103 mmol/L (ref 98–111)
Creatinine, Ser: 1.28 mg/dL — ABNORMAL HIGH (ref 0.61–1.24)
GFR, Estimated: >60 mL/min (ref >60)
Glucose, Bld: 110 mg/dL — ABNORMAL HIGH (ref 70–99)
Potassium: 3.9 mmol/L (ref 3.5–5.1)
Sodium: 138 mmol/L (ref 135–145)
Total Bilirubin: 1.5 mg/dL — ABNORMAL HIGH (ref 0.0–1.2)
Total Protein: 7.3 g/dL (ref 6.5–8.1)

## 2024-06-27 LAB — LIPASE, BLOOD: Lipase: 35 U/L (ref 11–51)

## 2024-06-27 LAB — TROPONIN I (HIGH SENSITIVITY)
Troponin I (High Sensitivity): 3 ng/L (ref <18)
Troponin I (High Sensitivity): 3 ng/L (ref <18)

## 2024-06-27 MED ORDER — KETOROLAC TROMETHAMINE 30 MG/ML IJ SOLN
30.0000 mg | Freq: Once | INTRAMUSCULAR | Status: AC
  Administered 2024-06-27: 30 mg via INTRAVENOUS
  Filled 2024-06-27: qty 1

## 2024-06-27 MED ORDER — ALUM & MAG HYDROXIDE-SIMETH 200-200-20 MG/5ML PO SUSP
30.0000 mL | Freq: Once | ORAL | Status: AC
  Administered 2024-06-27: 30 mL via ORAL
  Filled 2024-06-27: qty 30

## 2024-06-27 MED ORDER — MORPHINE SULFATE (PF) 4 MG/ML IV SOLN
4.0000 mg | Freq: Once | INTRAVENOUS | Status: AC
  Administered 2024-06-27: 4 mg via INTRAVENOUS
  Filled 2024-06-27: qty 1

## 2024-06-27 MED ORDER — SODIUM CHLORIDE 0.9 % IV BOLUS (SEPSIS)
1000.0000 mL | Freq: Once | INTRAVENOUS | Status: AC
Start: 2024-06-27 — End: 2024-06-27
  Administered 2024-06-27: 1000 mL via INTRAVENOUS

## 2024-06-27 MED ORDER — METHOCARBAMOL 1000 MG/10ML IJ SOLN
500.0000 mg | Freq: Once | INTRAMUSCULAR | Status: AC
Start: 2024-06-27 — End: 2024-06-27
  Administered 2024-06-27: 500 mg via INTRAVENOUS
  Filled 2024-06-27: qty 5

## 2024-06-27 MED ORDER — LIDOCAINE VISCOUS HCL 2 % MT SOLN
15.0000 mL | Freq: Once | OROMUCOSAL | Status: AC
  Administered 2024-06-27: 15 mL via OROMUCOSAL
  Filled 2024-06-27: qty 15

## 2024-06-27 MED ORDER — OXYCODONE HCL 5 MG PO TABS: 5.0000 mg | ORAL_TABLET | Freq: Three times a day (TID) | ORAL | 0 refills | Status: DC | PRN

## 2024-06-27 MED ORDER — PANTOPRAZOLE SODIUM 40 MG IV SOLR
40.0000 mg | Freq: Once | INTRAVENOUS | Status: AC
  Administered 2024-06-27: 40 mg via INTRAVENOUS
  Filled 2024-06-27: qty 10

## 2024-06-27 MED ORDER — IBUPROFEN 800 MG PO TABS: 800.0000 mg | ORAL_TABLET | Freq: Three times a day (TID) | ORAL | 0 refills | Status: DC | PRN

## 2024-06-27 MED ORDER — IOHEXOL 350 MG/ML SOLN
100.0000 mL | Freq: Once | INTRAVENOUS | Status: AC | PRN
Start: 2024-06-27 — End: 2024-06-27
  Administered 2024-06-27: 100 mL via INTRAVENOUS

## 2024-06-27 MED ORDER — ONDANSETRON HCL 4 MG/2ML IJ SOLN
4.0000 mg | Freq: Once | INTRAMUSCULAR | Status: AC
Start: 2024-06-27 — End: 2024-06-27
  Administered 2024-06-27: 4 mg via INTRAVENOUS
  Filled 2024-06-27: qty 2

## 2024-06-27 MED ORDER — ONDANSETRON 4 MG PO TBDP
4.0000 mg | ORAL_TABLET | Freq: Four times a day (QID) | ORAL | 0 refills | Status: AC | PRN
Start: 2024-06-27 — End: ?

## 2024-06-27 MED ORDER — METHOCARBAMOL 500 MG PO TABS: 500.0000 mg | ORAL_TABLET | Freq: Three times a day (TID) | ORAL | 0 refills | Status: DC | PRN

## 2024-06-27 NOTE — Discharge Instructions (Addendum)
 You may alternate over the counter Tylenol 1000 mg every 6 hours as needed for pain, fever and Ibuprofen 800 mg every 6-8 hours as needed for pain, fever.  Please take Ibuprofen with food.  Do not take more than 4000 mg of Tylenol (acetaminophen) in a 24 hour period.  You are being provided a prescription for opiates (also known as narcotics) for pain control.  Opiates can be addictive and should only be used when absolutely necessary for pain control when other alternatives do not work.  We recommend you only use them for the recommended amount of time and only as prescribed.  Please do not take with other sedative medications or alcohol.  Please do not drive, operate machinery, make important decisions while taking opiates.  Please note that these medications can be addictive and have high abuse potential.  Patients can become addicted to narcotics after only taking them for a few days.  Please keep these medications locked away from children, teenagers or any family members with history of substance abuse.  Narcotic pain medicine may also make you constipated.  You may use over-the-counter medications such as MiraLAX, Colace to prevent constipation.  If you become constipated, you may use over-the-counter enemas as needed.  Itching and nausea are also common side effects of narcotic pain medication.  If you develop uncontrolled vomiting or a rash, please stop these medications and seek medical care.  Your lab work, CT of your chest, CT of your abdomen pelvis were reassuring today.  I suspect that your pain may be musculoskeletal in nature.  Please rest, take your pain medications as prescribed.  Please no heavy lifting or increased physical exertion over the next few days.  If symptoms are not improving by Monday, I recommend close follow-up with your primary care doctor.

## 2024-06-27 NOTE — ED Provider Notes (Signed)
 [facility] Provider Note    Event Date/Time   First MD Initiated Contact with Patient 06/26/24 2345     (approximate)   History   Abdominal Pain   HPI  Kenneth Friedman. is a 48 y.o. male with history of hairy cell leukemia currently in remission since 2016 who presents to the emergency department with left upper abdominal pain worse with palpation and deep inspiration, chest tightness today.  No shortness of breath, fevers, cough, night sweats, weight loss, vomiting, diarrhea, bloody stools or melena, easy bruising or bleeding.  States he has always had some left upper abdominal pain since his diagnosis but today it was worse.  Denies any injury to this area.  No prior history of PE or DVT.  No calf tenderness or calf swelling.  Has had previous cholecystectomy, hernia repair.   History provided by patient, wife.    Past Medical History:  Diagnosis Date   Allergy    seasonal   Hairy cell leukemia (HCC)    Hairy cell leukemia (HCC) 02/09/2015    Past Surgical History:  Procedure Laterality Date   CHOLECYSTECTOMY  12/15/14   HERNIA REPAIR  2011   INGUINAL HERNIA REPAIR     ROTATOR CUFF REPAIR Right 2009, 2010    MEDICATIONS:  Prior to Admission medications   Medication Sig Start Date End Date Taking? Authorizing Provider  ascorbic acid (VITAMIN C) 1000 MG tablet Take by mouth.    [provider]  cholecalciferol (VITAMIN D3) 25 MCG (1000 UNIT) tablet  04/25/20   [provider]  Cholecalciferol (VITAMIN D3) 75 MCG (3000 UT) TABS Take by mouth.    [provider]  Cholecalciferol 25 MCG (1000 UT) tablet Take by mouth.    [provider]  Coenzyme Q10 10 MG capsule Take by mouth.    [provider]  Cyanocobalamin (VITAMIN B12) 1000 MCG TBCR  06/25/21   [provider]  ELDERBERRY PO Take by mouth.    [provider]  ELDERBERRY PO Take by mouth.    [provider]   hydrocortisone (ANUSOL-HC) 2.5 % rectal cream APPLY TOPICALLY TWICE DAILY AS NEEDED FOR HEMORRHOIDS 07/17/16   [provider]  ibuprofen (ADVIL,MOTRIN) 200 MG tablet Take 200 mg by mouth every 6 (six) hours as needed.    [provider]  Misc Natural Products (YUMVS BEET ROOT-TART CHERRY) 250-0.5 MG CHEW  06/25/21   [provider]  Multiple Vitamin (MULTIVITAMIN) tablet Take 1 tablet by mouth daily.    [provider]  nystatin  cream (MYCOSTATIN ) Apply to affected area 2 times daily 12/29/23   Corlis Burnard DEL, NP  Omega-3 Fatty Acids (FISH OIL) 300 MG CAPS Take by mouth.    [provider]  WHEY PROTEIN PO Take by mouth.    [provider]  Zinc 50 MG TABS  05/26/21   [provider]    Physical Exam   Triage Vital Signs: ED Triage Vitals  Encounter Vitals Group     BP 06/26/24 2337 (!) 147/98     Girls Systolic BP Percentile --      Girls Diastolic BP Percentile --      Boys Systolic BP Percentile --      Boys Diastolic BP Percentile --      Pulse Rate 06/26/24 2337 76     Resp 06/26/24 2337 18     Temp 06/26/24 2337 97.8 F (36.6 C)     Temp  Source 06/26/24 2337 Oral     SpO2 06/26/24 2337 97 %     Weight 06/26/24 2338 270 lb (122.5 kg)     Height 06/26/24 2338 6' 1 (1.854 m)     Head Circumference --      Peak Flow --      Pain Score 06/26/24 2337 9     Pain Loc --      Pain Education --      Exclude from Growth Chart --     Most recent vital signs: Vitals:   06/27/24 0200 06/27/24 0308  BP: 121/83 125/76  Pulse: 61 73  Resp: 11 11  Temp:    SpO2:  97%    CONSTITUTIONAL: Alert, responds appropriately to questions. Well-appearing; well-nourished HEAD: Normocephalic, atraumatic EYES: Conjunctivae clear, pupils appear equal, sclera nonicteric ENT: normal nose; moist mucous membranes NECK: Supple, normal ROM CARD: RRR; S1 and S2 appreciated; no chest wall tenderness, crepitus or deformity noted RESP:  Normal chest excursion without splinting or tachypnea; breath sounds clear and equal bilaterally; no wheezes, no rhonchi, no rales, no hypoxia or respiratory distress, speaking full sentences ABD/GI: Non-distended; soft, tender to palpation in epigastric region the left upper quadrant without overlying skin changes, no guarding or rebound BACK: The back appears normal EXT: Normal ROM in all joints; no deformity noted, no edema SKIN: Normal color for age and race; warm; no rash on exposed skin NEURO: Moves all extremities equally, normal speech PSYCH: The patient's mood and manner are appropriate.   ED Results / Procedures / Treatments   LABS: (all labs ordered are listed, but only abnormal results are displayed) Labs Reviewed  COMPREHENSIVE METABOLIC PANEL WITH GFR - Abnormal; Notable for the following components:      Result Value   Glucose, Bld 110 (*)    Creatinine, Ser 1.28 (*)    Total Bilirubin 1.5 (*)    All other components within normal limits  CBC - Abnormal; Notable for the following components:   Platelets 138 (*)    All other components within normal limits  URINALYSIS, ROUTINE W REFLEX MICROSCOPIC - Abnormal; Notable for the following components:   Color, Urine YELLOW (*)    APPearance CLEAR (*)    All other components within normal limits  LIPASE, BLOOD  TROPONIN I (HIGH SENSITIVITY)  TROPONIN I (HIGH SENSITIVITY)     EKG:  EKG Interpretation Date/Time:  Thursday June 26 2024 23:43:03 EDT Ventricular Rate:  72 PR Interval:  146 QRS Duration:  88 QT Interval:  388 QTC Calculation: 424 R Axis:   28  Text Interpretation: Normal sinus rhythm Cannot rule out Anterior infarct (cited on or before 17-Apr-2016) Abnormal ECG When compared with ECG of 17-Apr-2016 04:15, No significant change was found Confirmed by Neomi Neptune [phone]) on 06/27/2024 12:17:30 AM         RADIOLOGY: My personal review and interpretation of imaging: CT scan showed no PE,  splenomegaly, splenic infarct, splenic rupture.  I have personally reviewed all radiology reports.   CT ABDOMEN PELVIS W CONTRAST Result Date: 06/27/2024 EXAM: CT ABDOMEN AND PELVIS WITH CONTRAST 06/27/2024 01:00:41 AM TECHNIQUE: CT of the abdomen and pelvis was performed with the administration of 100 mL of iohexol  (OMNIPAQUE ) 350 MG/ML injection. Multiplanar reformatted images are provided for review. Automated exposure control, iterative reconstruction, and/or weight-based adjustment of the mA/kV was utilized to reduce the radiation dose to as low as reasonably achievable. COMPARISON: 08/09/2026 CLINICAL HISTORY: LUQ pain. Pt arrives POV, ambulatory to  triage, gait steady, no acute distress noted c/o spleen pain; pt reports LUQ pain all day, denies n/v. Hx of hairy cell leukemia in 2015 and has had intermittent spleen pain since and has managed until tonight, pain is worse. Pt also reports pain w/ inspiration. FINDINGS: LOWER CHEST: Findings in the chest are reported separately. LIVER: Unchanged hemangioma in the medial right hepatic lobe. GALLBLADDER AND BILE DUCTS: Cholecystectomy. No biliary ductal dilatation. SPLEEN: No acute abnormality. PANCREAS: No acute abnormality. ADRENAL GLANDS: No acute abnormality. KIDNEYS, URETERS AND BLADDER: No stones in the kidneys or ureters. No hydronephrosis. No perinephric or periureteral stranding. Urinary bladder is unremarkable. GI AND BOWEL: Stomach demonstrates no acute abnormality. There is no bowel obstruction. Normal appendix. PERITONEUM AND RETROPERITONEUM: No ascites. No free air. VASCULATURE: Aorta is normal in caliber. LYMPH NODES: No lymphadenopathy. REPRODUCTIVE ORGANS: No acute abnormality. BONES AND SOFT TISSUES: Postoperative changes of ventral hernia repair. No acute osseous abnormality. IMPRESSION: 1. No acute findings in the abdomen or pelvis. Electronically signed by: Norman Gatlin MD 06/27/2024 01:15 AM EDT RP Workstation: [id number]   CT  Angio Chest PE W and/or Wo Contrast Result Date: 06/27/2024 EXAM: CTA CHEST 06/27/2024 01:00:41 AM TECHNIQUE: CTA of the chest was performed without and with the administration of 100 mL of iohexol  (OMNIPAQUE ) 350 MG/ML injection. Multiplanar reformatted images are provided for review. MIP images are provided for review. Automated exposure control, iterative reconstruction, and/or weight based adjustment of the mA/kV was utilized to reduce the radiation dose to as low as reasonably achievable. COMPARISON: Comparison with CT 10/18/2023. CLINICAL HISTORY: Pulmonary embolism (PE) suspected, high prob. Pt arrives POV, ambulatory to triage, gait steady, no acute distress noted c/o spleen pain; pt reports LUQ pain all day, denies n/v. Hx of hairy cell leukemia in 2015 and has had intermittent spleen pain since and has managed until tonight, pain is worse. Pt also reports pain w/ inspiration. FINDINGS: PULMONARY ARTERIES: Pulmonary arteries are adequately opacified for evaluation. No acute pulmonary embolus. Main pulmonary artery is normal in caliber. MEDIASTINUM: The heart and pericardium demonstrate no acute abnormality. There is no acute abnormality of the thoracic aorta. LYMPH NODES: No mediastinal, hilar or axillary lymphadenopathy. LUNGS AND PLEURA: The lungs are without acute process. No focal consolidation or pulmonary edema. No evidence of pleural effusion or pneumothorax. UPPER ABDOMEN: Findings in the abdomen reported separately. SOFT TISSUES AND BONES: No acute bone or soft tissue abnormality. IMPRESSION: 1. No pulmonary embolism. Electronically signed by: Norman Gatlin MD 06/27/2024 01:12 AM EDT RP Workstation: [id number]     PROCEDURES:  Critical Care performed: No    .1-3 Lead EKG Interpretation  Performed by: Bryanah Sidell, Josette SAILOR, DO Authorized by: Jahara Dail, Josette SAILOR, DO     Interpretation: normal     ECG rate:  65   ECG rate assessment: normal     Rhythm: sinus rhythm     Ectopy: none      Conduction: normal       IMPRESSION / MDM / ASSESSMENT AND PLAN / ED COURSE  I reviewed the triage vital signs and the nursing notes.    Patient here with left upper abdominal pain, worse with deep inspiration.  The patient is on the cardiac monitor to evaluate for evidence of arrhythmia and/or significant heart rate changes.   DIFFERENTIAL DIAGNOSIS (includes but not limited to):   Splenomegaly, splenic infarct, splenic rupture, recurrent cancer, PE, pneumonia, pancreatitis, gastritis, colitis, bowel obstruction, musculoskeletal pain, ACS, dissection   Patient's presentation is most consistent with  acute presentation with potential threat to life or bodily function.   PLAN: Will obtain labs, urine, CTA of the chest, CT of the abdomen pelvis.  Will give pain medication.  EKG nonischemic.   MEDICATIONS GIVEN IN ED: Medications  sodium chloride  0.9 % bolus 1,000 mL (0 mLs Intravenous Stopped 06/27/24 0218)  morphine (PF) 4 MG/ML injection 4 mg (4 mg Intravenous Given 06/27/24 0046)  ondansetron (ZOFRAN) injection 4 mg (4 mg Intravenous Given 06/27/24 0045)  iohexol  (OMNIPAQUE ) 350 MG/ML injection 100 mL (100 mLs Intravenous Contrast Given 06/27/24 0056)  pantoprazole (PROTONIX) injection 40 mg (40 mg Intravenous Given 06/27/24 0219)  alum & mag hydroxide-simeth (MAALOX/MYLANTA) 200-200-20 MG/5ML suspension 30 mL (30 mLs Oral Given 06/27/24 0219)  lidocaine (XYLOCAINE) 2 % viscous mouth solution 15 mL (15 mLs Mouth/Throat Given 06/27/24 0219)  ketorolac (TORADOL) 30 MG/ML injection 30 mg (30 mg Intravenous Given 06/27/24 0319)  methocarbamol (ROBAXIN) injection 500 mg (500 mg Intravenous Given 06/27/24 0322)     ED COURSE: 1:30 AM  Patient reports pain is now moved more to the center of the abdomen and into the chest.  Will give Protonix, GI cocktail and reassess.  First troponin negative.  Second pending.  Normal hemoglobin, white blood cell count, LFTs, creatinine.  Lipase pending.  CT  scans reviewed and interpreted by myself and the radiologist and show no acute abnormality.  No splenic infarct, rupture, splenomegaly, sign of malignancy, PE, pneumonia.  3:13 AM  Pt's lipase is normal.  Second troponin negative.  Still complaining of pain and feels like it is worse with twisting, turning, bending.  We discussed that this could be musculoskeletal in nature.  He denies any increased physical exertion, trauma.  Will give Toradol, Robaxin.  He denies any relief with GI medications.  4:05 AM  Pt reports feeling better after Toradol and Robaxin and has been able to sleep.  He states he still has pain whenever he tries to sit upright in the bed.  We discussed that this seems more musculoskeletal in nature but given his history I have encouraged him to follow-up with his primary care doctor if symptoms  are not improving by the beginning of next week.  Will discharge with pain medication.   At this time, I do not feel there is any life-threatening condition present. I reviewed all nursing notes, vitals, pertinent previous records.  All lab and urine results, EKGs, imaging ordered have been independently reviewed and interpreted by myself.  I reviewed all available radiology reports from any imaging ordered this visit.  Based on my assessment, I feel the patient is safe to be discharged home without further emergent workup and can continue workup as an outpatient as needed. Discussed all findings, treatment plan as well as usual and customary return precautions.  They verbalize understanding and are comfortable with this plan.  Outpatient follow-up has been provided as needed.  All questions have been answered.    CONSULTS:  none   OUTSIDE RECORDS REVIEWED: Reviewed oncology notes.       FINAL CLINICAL IMPRESSION(S) / ED DIAGNOSES   Final diagnoses:  LUQ abdominal pain     Rx / DC Orders   ED Discharge Orders          Ordered    ibuprofen (ADVIL) 800 MG tablet  Every 8  hours PRN        06/27/24 0315    methocarbamol (ROBAXIN) 500 MG tablet  Every 8 hours PRN  06/27/24 0315    oxyCODONE (ROXICODONE) 5 MG immediate release tablet  Every 8 hours PRN        06/27/24 0406    ondansetron (ZOFRAN-ODT) 4 MG disintegrating tablet  Every 6 hours PRN        06/27/24 0406             Note:  This document was prepared using Dragon voice recognition software and may include unintentional dictation errors.   Cherina Dhillon, Josette SAILOR, DO 06/27/24 [phone]

## 2024-06-27 NOTE — Progress Notes (Signed)
 Symptom Management Clinic [facility] Telephone:([phone] Fax:([phone]) [phone]  Patient Care Team: Stanton Lynwood FALCON, MD as PCP - General (Family Medicine) Dellie Louanne MATSU, MD (General Surgery) Rennie Cindy SAUNDERS, MD as Consulting Physician (Oncology)   NAME OF PATIENT: Kenneth Friedman  [id number]  1976-01-06   DATE OF VISIT: 06/27/24  REASON FOR CONSULT: Kenneth Friedman Kenneth Friedman. is a 48 y.o. 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   INTERVAL HISTORY: Patient last saw Dr. Rennie on 03/10/2024 at which time patient was having back and hip pain.  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  Plan was for continued surveillance.  Patient presented to the emergency department this AM with complaint of abdominal pain worse with palpation and deep inspiration.  EKG was nonischemic.  Patient had CTA of the chest and CT of the abdomen pelvis without any acute findings.  Labs were also unrevealing.  Patient was discharged home on oxycodone and Robaxin and advised to follow-up with PCP if symptoms persisted.  Patient asked for [facility] evaluation today due to ongoing pain.  Patient says pain started yesterday after he was lifting heavy boxes at work.  Says he is still having pain in the left side of his abdomen.  Denies changes in characteristic or severity.  No fever or chills.  Denies nausea, vomiting, diarrhea.  Reports a good bowel movement yesterday.  Denies urinary symptoms or hematuria.  No chest pain or shortness of breath.   Patient offers no further specific complaints today.   PAST MEDICAL HISTORY: Past Medical History:  Diagnosis Date   Allergy    seasonal   Hairy cell leukemia (HCC)    Hairy cell leukemia (HCC) 02/09/2015    PAST SURGICAL HISTORY:  Past Surgical History:  Procedure Laterality Date   CHOLECYSTECTOMY  12/15/14   HERNIA  REPAIR  2011   INGUINAL HERNIA REPAIR     ROTATOR CUFF REPAIR Right 2009, 2010    HEMATOLOGY/ONCOLOGY HISTORY:  Oncology History Overview Note  # hairy cell leukemia [dec 2015-2016].  He presented with symptomatic splenomegaly and mild leukopenia and thrombocytopenia.   s/p cladribine (09/30/2014 - 10/07/2014).     He has a history of severe abdominal pain. Abdominal and pelvic CT scan on 11/30/2014 revealed chronic cholelithiasis. He underwent cholecystectomy on 12/15/2014.   Abdominal pain resolved after changing jobs.   He has a history of intermittent mild hyperbilirubinemia (1.1 - 1.9).  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   Hairy cell leukemia not having achieved remission (HCC)  02/09/2015 Initial Diagnosis   Hairy cell leukemia (HCC)     ALLERGIES:  is allergic to fentanyl and diclofenac.  MEDICATIONS:  Current Outpatient Medications  Medication Sig Dispense Refill   ascorbic acid (VITAMIN C) 1000 MG tablet Take by mouth.     cholecalciferol (VITAMIN D3) 25 MCG (1000 UNIT) tablet      Cholecalciferol (VITAMIN D3) 75 MCG (3000 UT) TABS Take by mouth.     Cholecalciferol 25 MCG (1000 UT) tablet Take by mouth.     Coenzyme Q10 10 MG capsule Take by mouth.     Cyanocobalamin (VITAMIN B12) 1000 MCG TBCR      ELDERBERRY PO Take by mouth.     ELDERBERRY PO Take by mouth.     hydrocortisone (ANUSOL-HC) 2.5 % rectal cream APPLY TOPICALLY TWICE DAILY AS NEEDED FOR HEMORRHOIDS     ibuprofen (  ADVIL) 800 MG tablet Take 1 tablet (800 mg total) by mouth every 8 (eight) hours as needed. 30 tablet 0   methocarbamol (ROBAXIN) 500 MG tablet Take 1 tablet (500 mg total) by mouth every 8 (eight) hours as needed. 15 tablet 0   Misc Natural Products (YUMVS BEET ROOT-TART CHERRY) 250-0.5 MG CHEW      Multiple Vitamin (MULTIVITAMIN) tablet Take 1 tablet by mouth daily.     nystatin  cream (MYCOSTATIN ) Apply to affected area 2 times daily 30 g 1   Omega-3 Fatty  Acids (FISH OIL) 300 MG CAPS Take by mouth.     ondansetron (ZOFRAN-ODT) 4 MG disintegrating tablet Take 1 tablet (4 mg total) by mouth every 6 (six) hours as needed for nausea or vomiting. 20 tablet 0   oxyCODONE (ROXICODONE) 5 MG immediate release tablet Take 1 tablet (5 mg total) by mouth every 8 (eight) hours as needed. 12 tablet 0   WHEY PROTEIN PO Take by mouth.     Zinc 50 MG TABS      No current facility-administered medications for this visit.    VITAL SIGNS: There were no vitals taken for this visit. There were no vitals filed for this visit.  Estimated body mass index is 35.62 kg/m as calculated from the following:   Height as of 06/26/24: 6' 1 (1.854 m).   Weight as of 06/26/24: 270 lb (122.5 kg).  LABS: CBC:    Component Value Date/Time   WBC 8.2 06/26/2024 2341   HGB 15.7 06/26/2024 2341   HGB 16.1 03/10/2024 1012   HGB 15.9 01/11/2015 1014   HCT 43.7 06/26/2024 2341   HCT 46.1 01/11/2015 1014   PLT 138 (L) 06/26/2024 2341   PLT 129 (L) 03/10/2024 1012   PLT 112 (L) 01/11/2015 1014   MCV 85.7 06/26/2024 2341   MCV 87 01/11/2015 1014   NEUTROABS 4.1 03/10/2024 1012   NEUTROABS 3.4 01/11/2015 1014   LYMPHSABS 1.6 03/10/2024 1012   LYMPHSABS 0.6 (L) 01/11/2015 1014   MONOABS 0.4 03/10/2024 1012   MONOABS 0.4 01/11/2015 1014   EOSABS 0.2 03/10/2024 1012   EOSABS 0.2 01/11/2015 1014   BASOSABS 0.0 03/10/2024 1012   BASOSABS 0.0 01/11/2015 1014   Comprehensive Metabolic Panel:    Component Value Date/Time   NA 138 06/26/2024 2341   NA 137 01/11/2015 1014   K 3.9 06/26/2024 2341   K 3.9 01/11/2015 1014   CL 103 06/26/2024 2341   CL 107 01/11/2015 1014   CO2 24 06/26/2024 2341   CO2 24 01/11/2015 1014   BUN 20 06/26/2024 2341   BUN 16 01/11/2015 1014   CREATININE 1.28 (H) 06/26/2024 2341   CREATININE 1.11 03/10/2024 1013   CREATININE 1.09 01/11/2015 1014   GLUCOSE 110 (H) 06/26/2024 2341   GLUCOSE 116 (H) 01/11/2015 1014   CALCIUM 9.5 06/26/2024 2341    CALCIUM 9.3 01/11/2015 1014   AST 27 06/26/2024 2341   AST 26 03/10/2024 1013   ALT 35 06/26/2024 2341   ALT 37 03/10/2024 1013   ALT 27 01/11/2015 1014   ALKPHOS 68 06/26/2024 2341   ALKPHOS 85 01/11/2015 1014   BILITOT 1.5 (H) 06/26/2024 2341   BILITOT 1.0 03/10/2024 1013   PROT 7.3 06/26/2024 2341   PROT 8.0 01/11/2015 1014   ALBUMIN 4.2 06/26/2024 2341   ALBUMIN 4.6 01/11/2015 1014    RADIOGRAPHIC STUDIES: CT ABDOMEN PELVIS W CONTRAST Result Date: 06/27/2024 EXAM: CT ABDOMEN AND PELVIS WITH CONTRAST 06/27/2024 01:00:41 AM  TECHNIQUE: CT of the abdomen and pelvis was performed with the administration of 100 mL of iohexol  (OMNIPAQUE ) 350 MG/ML injection. Multiplanar reformatted images are provided for review. Automated exposure control, iterative reconstruction, and/or weight-based adjustment of the mA/kV was utilized to reduce the radiation dose to as low as reasonably achievable. COMPARISON: 08/09/2026 CLINICAL HISTORY: LUQ pain. Pt arrives POV, ambulatory to triage, gait steady, no acute distress noted c/o spleen pain; pt reports LUQ pain all day, denies n/v. Hx of hairy cell leukemia in 2015 and has had intermittent spleen pain since and has managed until tonight, pain is worse. Pt also reports pain w/ inspiration. FINDINGS: LOWER CHEST: Findings in the chest are reported separately. LIVER: Unchanged hemangioma in the medial right hepatic lobe. GALLBLADDER AND BILE DUCTS: Cholecystectomy. No biliary ductal dilatation. SPLEEN: No acute abnormality. PANCREAS: No acute abnormality. ADRENAL GLANDS: No acute abnormality. KIDNEYS, URETERS AND BLADDER: No stones in the kidneys or ureters. No hydronephrosis. No perinephric or periureteral stranding. Urinary bladder is unremarkable. GI AND BOWEL: Stomach demonstrates no acute abnormality. There is no bowel obstruction. Normal appendix. PERITONEUM AND RETROPERITONEUM: No ascites. No free air. VASCULATURE: Aorta is normal in caliber. LYMPH NODES:  No lymphadenopathy. REPRODUCTIVE ORGANS: No acute abnormality. BONES AND SOFT TISSUES: Postoperative changes of ventral hernia repair. No acute osseous abnormality. IMPRESSION: 1. No acute findings in the abdomen or pelvis. Electronically signed by: Norman Gatlin MD 06/27/2024 01:15 AM EDT RP Workstation: [id number]   CT Angio Chest PE W and/or Wo Contrast Result Date: 06/27/2024 EXAM: CTA CHEST 06/27/2024 01:00:41 AM TECHNIQUE: CTA of the chest was performed without and with the administration of 100 mL of iohexol  (OMNIPAQUE ) 350 MG/ML injection. Multiplanar reformatted images are provided for review. MIP images are provided for review. Automated exposure control, iterative reconstruction, and/or weight based adjustment of the mA/kV was utilized to reduce the radiation dose to as low as reasonably achievable. COMPARISON: Comparison with CT 10/18/2023. CLINICAL HISTORY: Pulmonary embolism (PE) suspected, high prob. Pt arrives POV, ambulatory to triage, gait steady, no acute distress noted c/o spleen pain; pt reports LUQ pain all day, denies n/v. Hx of hairy cell leukemia in 2015 and has had intermittent spleen pain since and has managed until tonight, pain is worse. Pt also reports pain w/ inspiration. FINDINGS: PULMONARY ARTERIES: Pulmonary arteries are adequately opacified for evaluation. No acute pulmonary embolus. Main pulmonary artery is normal in caliber. MEDIASTINUM: The heart and pericardium demonstrate no acute abnormality. There is no acute abnormality of the thoracic aorta. LYMPH NODES: No mediastinal, hilar or axillary lymphadenopathy. LUNGS AND PLEURA: The lungs are without acute process. No focal consolidation or pulmonary edema. No evidence of pleural effusion or pneumothorax. UPPER ABDOMEN: Findings in the abdomen reported separately. SOFT TISSUES AND BONES: No acute bone or soft tissue abnormality. IMPRESSION: 1. No pulmonary embolism. Electronically signed by: Norman Gatlin MD 06/27/2024  01:12 AM EDT RP Workstation: [id number]    PERFORMANCE STATUS (ECOG) : 0 - Asymptomatic  Review of Systems Unless otherwise noted, a complete review of systems is negative.  Physical Exam General: NAD Cardiovascular: regular rate and rhythm Pulmonary: clear anterior/posterior fields Abdomen: soft, tender left side, + bowel sounds GU: no suprapubic tenderness Extremities: no edema, no joint deformities Skin: no rashes Neurological: nonfocal  IMPRESSION/PLAN: Hairy cell leukemia - CBC from ED grossly normal other than mild thrombocytopenia, which is essentially at patient's baseline.  Abdominal pain - ED physician felt that this was likely musculoskeletal as patient had fairly comprehensive workup including CTA of  the chest, CT of the abdomen and pelvis, and EKG, all of which were unremarkable.  Lipase was WNL.  Thrombocytopenia is stable and no findings for spleen on imaging.  Patient did have slightly elevated bilirubin and serum creatinine but this appears consistent with patient's normal range.  Workup is reassuring.  Discussed with Dr. Rennie who also reviewed ED workup.  Patient advised to treat conservatively with prescribed oxycodone/Robaxin from the emergency department.  ED triggers reviewed.   Patient expressed understanding and was in agreement with this plan. He also understands that He can call clinic at any time with any questions, concerns, or complaints.   Thank you for allowing me to participate in the care of this very pleasant patient.   Time Total: 20 minutes  Visit consisted of counseling and education dealing with the complex and emotionally intense issues of symptom management in the setting of serious illness.Greater than 50%  of this time was spent counseling and coordinating care related to the above assessment and plan.  Signed by: Fonda Mower, PhD, NP-C

## 2024-06-27 NOTE — Progress Notes (Signed)
 Left mid sided pain has been coming and going got worse last night and still worse today. 5/10 sitting still and 8 when moving. Meds from ED didn't help. Hasn't tried rx that was called in or ibuprofen.

## 2024-06-27 NOTE — Telephone Encounter (Signed)
 Per the patient he has been in the ER all night long and they did imaging and said it looked good but he still having the pain and he wanted to have Dr. Mela to have him seen today because of this pain that he is having

## 2024-09-10 ENCOUNTER — Inpatient Hospital Stay: Admitting: Internal Medicine

## 2024-09-10 ENCOUNTER — Inpatient Hospital Stay: Attending: Hospice and Palliative Medicine

## 2024-09-10 ENCOUNTER — Encounter: Payer: Self-pay | Admitting: Internal Medicine

## 2024-09-10 VITALS — BP 128/83 | HR 70 | Temp 95.5°F | Resp 18 | Ht 73.0 in | Wt 265.0 lb

## 2024-09-10 DIAGNOSIS — C9140 Hairy cell leukemia not having achieved remission: Secondary | ICD-10-CM | POA: Insufficient documentation

## 2024-09-10 DIAGNOSIS — Z9221 Personal history of antineoplastic chemotherapy: Secondary | ICD-10-CM | POA: Diagnosis not present

## 2024-09-10 DIAGNOSIS — R238 Other skin changes: Secondary | ICD-10-CM | POA: Diagnosis not present

## 2024-09-10 LAB — CBC WITH DIFFERENTIAL (CANCER CENTER ONLY)
Abs Immature Granulocytes: 0.02 K/uL (ref 0.00–0.07)
Basophils Absolute: 0 K/uL (ref 0.0–0.1)
Basophils Relative: 1 %
Eosinophils Absolute: 0.1 K/uL (ref 0.0–0.5)
Eosinophils Relative: 1 %
HCT: 46.4 % (ref 39.0–52.0)
Hemoglobin: 16.5 g/dL (ref 13.0–17.0)
Immature Granulocytes: 0 %
Lymphocytes Relative: 29 %
Lymphs Abs: 1.4 K/uL (ref 0.7–4.0)
MCH: 30.3 pg (ref 26.0–34.0)
MCHC: 35.6 g/dL (ref 30.0–36.0)
MCV: 85.1 fL (ref 80.0–100.0)
Monocytes Absolute: 0.4 K/uL (ref 0.1–1.0)
Monocytes Relative: 7 %
Neutro Abs: 3 K/uL (ref 1.7–7.7)
Neutrophils Relative %: 62 %
Platelet Count: 127 K/uL — ABNORMAL LOW (ref 150–400)
RBC: 5.45 MIL/uL (ref 4.22–5.81)
RDW: 12.8 % (ref 11.5–15.5)
WBC Count: 4.9 K/uL (ref 4.0–10.5)
nRBC: 0 % (ref 0.0–0.2)

## 2024-09-10 LAB — CMP (CANCER CENTER ONLY)
ALT: 28 U/L (ref 0–44)
AST: 22 U/L (ref 15–41)
Albumin: 4.4 g/dL (ref 3.5–5.0)
Alkaline Phosphatase: 84 U/L (ref 38–126)
Anion gap: 12 (ref 5–15)
BUN: 18 mg/dL (ref 6–20)
CO2: 22 mmol/L (ref 22–32)
Calcium: 9.8 mg/dL (ref 8.9–10.3)
Chloride: 106 mmol/L (ref 98–111)
Creatinine: 1.22 mg/dL (ref 0.61–1.24)
GFR, Estimated: >60 mL/min (ref >60)
Glucose, Bld: 99 mg/dL (ref 70–99)
Potassium: 4.2 mmol/L (ref 3.5–5.1)
Sodium: 140 mmol/L (ref 135–145)
Total Bilirubin: 1 mg/dL (ref 0.0–1.2)
Total Protein: 7.3 g/dL (ref 6.5–8.1)

## 2024-09-10 LAB — LACTATE DEHYDROGENASE: LDH: 157 U/L (ref 105–235)

## 2024-09-10 NOTE — Assessment & Plan Note (Addendum)
#    Hairy cell leukemia [2015dec]-s/p cladribine [finished Jan 2016]; clinically is doing well without any evidence of infection/blood counts are normal except for mildly low platelets in  130-140s. Stable- Continue surveillance.  # Raised papule-  under his left knee-clinically NOT suggestive of melanoma-SCC versus benign lesion.  Recommend evaluation with dermatology.  # DISPOSITION: # follow up in 6 months- MD; labs- cbc/cmp; [facility]-  Dr.B

## 2024-09-10 NOTE — Progress Notes (Signed)
 Pt has a dark spot on his lt calf and scalp, he would like you to take a look at.

## 2024-09-10 NOTE — Addendum Note (Signed)
 Addended by: LAEL BROWNING A on: 09/10/2024 10:02 AM   Modules accepted: Orders

## 2024-09-10 NOTE — Progress Notes (Signed)
 [facility] CONSULT NOTE  Patient Care Team: Valora Lynwood FALCON, MD as PCP - General (Family Medicine) Dellie Louanne MATSU, MD (General Surgery) Rennie Cindy SAUNDERS, MD as Consulting Physician (Oncology)  CHIEF COMPLAINTS/PURPOSE OF CONSULTATION:  Hairy cell  leukemia  #  Oncology History Overview Note  # hairy cell leukemia [dec 2015-2016].  He presented with symptomatic splenomegaly and mild leukopenia and thrombocytopenia.   s/p cladribine (09/30/2014 - 10/07/2014).     He has a history of severe abdominal pain. Abdominal and pelvic CT scan on 11/30/2014 revealed chronic cholelithiasis. He underwent cholecystectomy on 12/15/2014.   Abdominal pain resolved after changing jobs.   He has a history of intermittent mild hyperbilirubinemia (1.1 - 1.9).  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  Ambulating independently.  Tanda JINNY Wray Mickey. 48 y.o.  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   Discussed the use of AI scribe software for clinical note transcription with the patient, who gave verbal consent to proceed.  History of Present Illness   Nathin Saran. is a 48 year old male with hairy cell leukemia who presents for routine hematology/oncology surveillance follow-up.  He is currently asymptomatic with no new complaints related to leukemia. Platelet counts have remained stable in the 120-130 range, with normal white blood cell count and hemoglobin. He is not receiving active therapy. He expresses ongoing anxiety regarding possible recurrence, particularly when experiencing nonspecific symptoms, but denies any current symptoms suggestive of relapse.  Several months ago, he experienced acute pain after lifting heavy  objects, which prompted an emergency room visit. The pain resolved without recurrence, and imaging at that time was unremarkable. He has since resumed normal activities without limitation or further pain.  He reports a rough spot on his scalp present for approximately two years, unchanged in appearance. He is concerned about possible skin malignancy and has had difficulty accessing dermatology evaluation. No other skin changes are reported.  He weighs approximately 265 pounds and describes psychological distress related to weight loss, associating it with vulnerability from his leukemia diagnosis and treatment. He acknowledges difficulty with weight management and the mental health impact of his cancer history, but denies any acute physical symptoms related to his weight.       Review of Systems  Constitutional:  Positive for malaise/fatigue. Negative for chills, diaphoresis, fever and weight loss.  HENT:  Negative for nosebleeds and sore throat.   Eyes:  Negative for double vision.  Respiratory:  Negative for cough, hemoptysis, sputum production, shortness of breath and wheezing.   Cardiovascular:  Negative for chest pain, palpitations, orthopnea and leg swelling.  Gastrointestinal:  Negative for abdominal pain, blood in stool, constipation, diarrhea, heartburn, melena, nausea and vomiting.  Genitourinary:  Negative for dysuria, frequency and urgency.  Musculoskeletal:  Negative for back pain and joint pain.  Skin: Negative.  Negative for itching and rash.  Neurological:  Negative for dizziness, tingling, focal weakness, weakness and headaches.  Endo/Heme/Allergies:  Does not bruise/bleed easily.  Psychiatric/Behavioral:  Negative for depression. The patient is not nervous/anxious and does not have insomnia.      MEDICAL HISTORY:  Past Medical History:  Diagnosis Date   Allergy    seasonal   Hairy cell leukemia (HCC)    Hairy cell leukemia (HCC) 02/09/2015  SURGICAL HISTORY: Past  Surgical History:  Procedure Laterality Date   CHOLECYSTECTOMY  12/15/14   HERNIA REPAIR  2011   INGUINAL HERNIA REPAIR     ROTATOR CUFF REPAIR Right 2009, 2010    SOCIAL HISTORY: Social History   Socioeconomic History   Marital status: Married    Spouse name: Not on file   Number of children: Not on file   Years of education: Not on file   Highest education level: Not on file  Occupational History   Not on file  Tobacco Use   Smoking status: Never   Smokeless tobacco: Never  Vaping Use   Vaping status: Never Used  Substance and Sexual Activity   Alcohol use: Not Currently   Drug use: No   Sexual activity: Not on file  Other Topics Concern   Not on file  Social History Narrative   Not on file   Social Drivers of Health   Tobacco Use: Low Risk (09/10/2024)   Patient History    Smoking Tobacco Use: Never    Smokeless Tobacco Use: Never    Passive Exposure: Not on file  Financial Resource Strain: Patient Declined (02/06/2024)   Received from [facility]   Overall Financial Resource Strain (CARDIA)    Difficulty of Paying Living Expenses: Patient declined  Food Insecurity: Patient Declined (02/06/2024)   Received from [facility]   Epic    Within the past 12 months, you worried that your food would run out before you got the money to buy more.: Patient declined    Within the past 12 months, the food you bought just didn't last and you didn't have money to get more.: Patient declined  Transportation Needs: Patient Declined (02/06/2024)   Received from [facility] - Transportation    In the past 12 months, has lack of transportation kept you from medical appointments or from getting medications?: Patient declined    Lack of Transportation (Non-Medical): Patient declined  Physical Activity: Not on file  Stress: Not on file  Social Connections: Not on file  Intimate Partner Violence: Not on file  Depression  (PHQ2-9): Low Risk (09/10/2024)   Depression (PHQ2-9)    PHQ-2 Score: 0  Alcohol Screen: Not on file  Housing: Patient Declined (02/06/2024)   Received from [facility]   Epic    In the last 12 months, was there a time when you were not able to pay the mortgage or rent on time?: Patient declined    Number of Times Moved in the Last Year: Not on file    At any time in the past 12 months, were you homeless or living in a shelter (including now)?: Patient declined  Utilities: Patient Declined (02/06/2024)   Received from [facility] Utilities    Threatened with loss of utilities: Patient declined  Health Literacy: Not on file    FAMILY HISTORY: Family History  Problem Relation Age of Onset   Hypertension Mother    Hypertension Father    Cancer Paternal Aunt    Cancer Paternal Grandmother    Cancer Paternal Grandfather     ALLERGIES:  is allergic to fentanyl and diclofenac.  MEDICATIONS:  Current Outpatient Medications  Medication Sig Dispense Refill   ascorbic acid (VITAMIN C) 1000 MG tablet Take by mouth.     atorvastatin (LIPITOR) 20 MG tablet Take 20 mg by mouth daily.     cholecalciferol (  VITAMIN D3) 25 MCG (1000 UNIT) tablet Take 1,000 Units by mouth daily.     Coenzyme Q10 10 MG capsule Take 10 mg by mouth daily.     ELDERBERRY PO Take by mouth daily.     hydrocortisone (ANUSOL-HC) 2.5 % rectal cream daily as needed.     magnesium oxide (MAG-OX) 400 (240 Mg) MG tablet Take 400 mg by mouth daily.     Multiple Vitamin (MULTIVITAMIN) tablet Take 1 tablet by mouth daily.     Omega-3 Fatty Acids (FISH OIL) 300 MG CAPS Take by mouth.     WHEY PROTEIN PO Take by mouth daily.     Zinc 50 MG TABS      Misc Natural Products (YUMVS BEET ROOT-TART CHERRY) 250-0.5 MG CHEW  (Patient not taking: Reported on 09/10/2024)     No current facility-administered medications for this visit.   PHYSICAL EXAMINATION: ECOG PERFORMANCE STATUS: 0 -  Asymptomatic  Vitals:   09/10/24 0920  BP: 128/83  Pulse: 70  Resp: 18  Temp: (!) 95.5 F (35.3 C)  SpO2: 98%    Filed Weights   09/10/24 0920  Weight: 265 lb (120.2 kg)   Raised papule-  under his left knee [chornic]  Physical Exam Vitals and nursing note reviewed.  Constitutional:      Comments:  Alone    HENT:     Head: Normocephalic and atraumatic.     Mouth/Throat:     Mouth: Mucous membranes are moist.     Pharynx: Oropharynx is clear. No oropharyngeal exudate.  Eyes:     Extraocular Movements: Extraocular movements intact.     Pupils: Pupils are equal, round, and reactive to light.  Cardiovascular:     Rate and Rhythm: Normal rate and regular rhythm.  Pulmonary:     Effort: Pulmonary effort is normal. No respiratory distress.     Breath sounds: Normal breath sounds. No wheezing.  Abdominal:     General: Bowel sounds are normal. There is no distension.     Palpations: Abdomen is soft. There is no mass.     Tenderness: There is no abdominal tenderness. There is no guarding or rebound.  Musculoskeletal:        General: No tenderness. Normal range of motion.     Cervical back: Normal range of motion and neck supple.  Skin:    General: Skin is warm.  Neurological:     General: No focal deficit present.     Mental Status: He is alert and oriented to person, place, and time.  Psychiatric:        Mood and Affect: Affect normal.        Behavior: Behavior normal.        Judgment: Judgment normal.     LABORATORY DATA:  I have reviewed the data as listed Lab Results  Component Value Date   WBC 4.9 09/10/2024   HGB 16.5 09/10/2024   HCT 46.4 09/10/2024   MCV 85.1 09/10/2024   PLT 127 (L) 09/10/2024   Recent Labs    03/10/24 1013 06/26/24 2341 09/10/24 0926  NA 137 138 140  K 3.9 3.9 4.2  CL 106 103 106  CO2 22 24 22   GLUCOSE 119* 110* 99  BUN 19 20 18   CREATININE 1.11 1.28* 1.22  CALCIUM 9.5 9.5 9.8  GFRNONAA >60 >60 >60  PROT 7.6 7.3 7.3   ALBUMIN 4.3 4.2 4.4  AST 26 27 22   ALT 37 35 28  ALKPHOS 66 68  84  BILITOT 1.0 1.5* 1.0    RADIOGRAPHIC STUDIES: I have personally reviewed the radiological images as listed and agreed with the findings in the report. No results found.  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 Stable- Continue surveillance.  # Raised papule-  under his left knee-clinically NOT suggestive of melanoma-SCC versus benign lesion.  Recommend evaluation with dermatology.  # DISPOSITION: # follow up in 6 months- MD; labs- cbc/cmp; [facility]-  Dr.B   All questions were answered. The patient knows to call the clinic with any problems, questions or concerns.    Cindy JONELLE Joe, MD 09/10/2024 9:55 AM

## 2025-03-11 ENCOUNTER — Inpatient Hospital Stay

## 2025-03-11 ENCOUNTER — Inpatient Hospital Stay: Admitting: Internal Medicine
